# Patient Record
Sex: Male | Born: 1960 | Race: White | Hispanic: No | Marital: Married | Smoking: Current every day smoker
Health system: Southern US, Community
[De-identification: ages and names within clinical notes are randomized; demographics above are authoritative.]

## PROBLEM LIST (undated history)

## (undated) DIAGNOSIS — F419 Anxiety disorder, unspecified: Secondary | ICD-10-CM

## (undated) DIAGNOSIS — I10 Essential (primary) hypertension: Secondary | ICD-10-CM

## (undated) DIAGNOSIS — Z8739 Personal history of other diseases of the musculoskeletal system and connective tissue: Secondary | ICD-10-CM

## (undated) DIAGNOSIS — H903 Sensorineural hearing loss, bilateral: Secondary | ICD-10-CM

## (undated) DIAGNOSIS — N189 Chronic kidney disease, unspecified: Secondary | ICD-10-CM

## (undated) DIAGNOSIS — E119 Type 2 diabetes mellitus without complications: Secondary | ICD-10-CM

## (undated) DIAGNOSIS — E785 Hyperlipidemia, unspecified: Secondary | ICD-10-CM

## (undated) DIAGNOSIS — K219 Gastro-esophageal reflux disease without esophagitis: Secondary | ICD-10-CM

## (undated) DIAGNOSIS — IMO0002 Reserved for concepts with insufficient information to code with codable children: Secondary | ICD-10-CM

## (undated) DIAGNOSIS — F1910 Other psychoactive substance abuse, uncomplicated: Secondary | ICD-10-CM

## (undated) DIAGNOSIS — I8290 Acute embolism and thrombosis of unspecified vein: Secondary | ICD-10-CM

## (undated) DIAGNOSIS — M940 Chondrocostal junction syndrome [Tietze]: Secondary | ICD-10-CM

## (undated) DIAGNOSIS — I639 Cerebral infarction, unspecified: Secondary | ICD-10-CM

## (undated) DIAGNOSIS — T7840XA Allergy, unspecified, initial encounter: Secondary | ICD-10-CM

## (undated) DIAGNOSIS — Z8619 Personal history of other infectious and parasitic diseases: Secondary | ICD-10-CM

## (undated) DIAGNOSIS — K5792 Diverticulitis of intestine, part unspecified, without perforation or abscess without bleeding: Secondary | ICD-10-CM

## (undated) DIAGNOSIS — G250 Essential tremor: Secondary | ICD-10-CM

## (undated) HISTORY — PX: INGUINAL HERNIA REPAIR: SHX194

## (undated) HISTORY — DX: Type 2 diabetes mellitus without complications: E11.9

## (undated) HISTORY — DX: Cerebral infarction, unspecified: I63.9

## (undated) HISTORY — PX: POLYPECTOMY: SHX149

## (undated) HISTORY — DX: Anxiety disorder, unspecified: F41.9

## (undated) HISTORY — DX: Reserved for concepts with insufficient information to code with codable children: IMO0002

## (undated) HISTORY — DX: Acute embolism and thrombosis of unspecified vein: I82.90

## (undated) HISTORY — DX: Personal history of other diseases of the musculoskeletal system and connective tissue: Z87.39

## (undated) HISTORY — DX: Essential (primary) hypertension: I10

## (undated) HISTORY — PX: COLONOSCOPY: SHX174

## (undated) HISTORY — DX: Sensorineural hearing loss, bilateral: H90.3

## (undated) HISTORY — DX: Essential tremor: G25.0

## (undated) HISTORY — DX: Chronic kidney disease, unspecified: N18.9

## (undated) HISTORY — DX: Chondrocostal junction syndrome (tietze): M94.0

## (undated) HISTORY — DX: Gastro-esophageal reflux disease without esophagitis: K21.9

## (undated) HISTORY — DX: Personal history of other infectious and parasitic diseases: Z86.19

## (undated) HISTORY — DX: Other psychoactive substance abuse, uncomplicated: F19.10

## (undated) HISTORY — PX: OTHER SURGICAL HISTORY: SHX169

## (undated) HISTORY — DX: Hyperlipidemia, unspecified: E78.5

## (undated) HISTORY — DX: Diverticulitis of intestine, part unspecified, without perforation or abscess without bleeding: K57.92

## (undated) HISTORY — DX: Allergy, unspecified, initial encounter: T78.40XA

---

## 1998-07-02 ENCOUNTER — Emergency Department (HOSPITAL_COMMUNITY): Admission: EM | Admit: 1998-07-02 | Discharge: 1998-07-02 | Payer: Self-pay | Admitting: Emergency Medicine

## 1998-07-03 ENCOUNTER — Encounter: Admission: RE | Admit: 1998-07-03 | Discharge: 1998-10-01 | Payer: Self-pay | Admitting: Internal Medicine

## 1999-12-23 ENCOUNTER — Inpatient Hospital Stay (HOSPITAL_COMMUNITY): Admission: EM | Admit: 1999-12-23 | Discharge: 1999-12-23 | Payer: Self-pay | Admitting: Internal Medicine

## 1999-12-23 ENCOUNTER — Encounter: Payer: Self-pay | Admitting: Internal Medicine

## 2005-10-07 ENCOUNTER — Ambulatory Visit: Payer: Self-pay | Admitting: Internal Medicine

## 2007-12-21 ENCOUNTER — Inpatient Hospital Stay (HOSPITAL_COMMUNITY): Admission: EM | Admit: 2007-12-21 | Discharge: 2007-12-24 | Payer: Self-pay | Admitting: Emergency Medicine

## 2007-12-21 ENCOUNTER — Ambulatory Visit: Payer: Self-pay | Admitting: Cardiology

## 2007-12-21 ENCOUNTER — Ambulatory Visit: Payer: Self-pay | Admitting: Pulmonary Disease

## 2007-12-21 ENCOUNTER — Encounter (INDEPENDENT_AMBULATORY_CARE_PROVIDER_SITE_OTHER): Payer: Self-pay | Admitting: Neurology

## 2007-12-22 ENCOUNTER — Encounter (INDEPENDENT_AMBULATORY_CARE_PROVIDER_SITE_OTHER): Payer: Self-pay | Admitting: Neurology

## 2007-12-23 ENCOUNTER — Encounter (INDEPENDENT_AMBULATORY_CARE_PROVIDER_SITE_OTHER): Payer: Self-pay | Admitting: Neurology

## 2007-12-28 ENCOUNTER — Encounter: Payer: Self-pay | Admitting: *Deleted

## 2007-12-28 DIAGNOSIS — Z8619 Personal history of other infectious and parasitic diseases: Secondary | ICD-10-CM | POA: Insufficient documentation

## 2007-12-28 DIAGNOSIS — Z8719 Personal history of other diseases of the digestive system: Secondary | ICD-10-CM | POA: Insufficient documentation

## 2007-12-28 DIAGNOSIS — M545 Low back pain, unspecified: Secondary | ICD-10-CM | POA: Insufficient documentation

## 2007-12-28 DIAGNOSIS — H903 Sensorineural hearing loss, bilateral: Secondary | ICD-10-CM

## 2007-12-28 DIAGNOSIS — Z9189 Other specified personal risk factors, not elsewhere classified: Secondary | ICD-10-CM | POA: Insufficient documentation

## 2007-12-28 DIAGNOSIS — Z9889 Other specified postprocedural states: Secondary | ICD-10-CM | POA: Insufficient documentation

## 2007-12-28 DIAGNOSIS — IMO0002 Reserved for concepts with insufficient information to code with codable children: Secondary | ICD-10-CM | POA: Insufficient documentation

## 2007-12-29 ENCOUNTER — Ambulatory Visit: Payer: Self-pay | Admitting: Internal Medicine

## 2007-12-29 DIAGNOSIS — I6340 Cerebral infarction due to embolism of unspecified cerebral artery: Secondary | ICD-10-CM

## 2008-01-05 ENCOUNTER — Telehealth: Payer: Self-pay | Admitting: Internal Medicine

## 2008-01-31 ENCOUNTER — Encounter: Payer: Self-pay | Admitting: Internal Medicine

## 2008-04-03 ENCOUNTER — Encounter: Payer: Self-pay | Admitting: Interventional Radiology

## 2008-04-06 ENCOUNTER — Ambulatory Visit: Payer: Self-pay | Admitting: Internal Medicine

## 2008-04-21 ENCOUNTER — Encounter: Payer: Self-pay | Admitting: Internal Medicine

## 2008-05-10 ENCOUNTER — Encounter: Payer: Self-pay | Admitting: Internal Medicine

## 2008-05-23 ENCOUNTER — Encounter: Payer: Self-pay | Admitting: Internal Medicine

## 2008-06-16 ENCOUNTER — Encounter: Payer: Self-pay | Admitting: Internal Medicine

## 2008-10-13 ENCOUNTER — Encounter: Payer: Self-pay | Admitting: Internal Medicine

## 2008-12-28 ENCOUNTER — Encounter: Payer: Self-pay | Admitting: Internal Medicine

## 2009-06-14 ENCOUNTER — Encounter: Payer: Self-pay | Admitting: Internal Medicine

## 2009-07-13 ENCOUNTER — Encounter: Payer: Self-pay | Admitting: Internal Medicine

## 2009-08-07 ENCOUNTER — Ambulatory Visit: Payer: Self-pay | Admitting: Internal Medicine

## 2009-08-07 DIAGNOSIS — M25569 Pain in unspecified knee: Secondary | ICD-10-CM

## 2009-10-29 ENCOUNTER — Ambulatory Visit: Payer: Self-pay | Admitting: Internal Medicine

## 2009-10-29 DIAGNOSIS — E785 Hyperlipidemia, unspecified: Secondary | ICD-10-CM

## 2009-10-29 DIAGNOSIS — E1169 Type 2 diabetes mellitus with other specified complication: Secondary | ICD-10-CM

## 2009-10-29 DIAGNOSIS — I10 Essential (primary) hypertension: Secondary | ICD-10-CM | POA: Insufficient documentation

## 2009-10-29 LAB — CONVERTED CEMR LAB
ALT: 37 units/L (ref 0–53)
Alkaline Phosphatase: 49 units/L (ref 39–117)
BUN: 9 mg/dL (ref 6–23)
Bilirubin, Direct: 0.1 mg/dL (ref 0.0–0.3)
Cholesterol: 150 mg/dL (ref 0–200)
Creatinine, Ser: 1.1 mg/dL (ref 0.4–1.5)
GFR calc non Af Amer: 75.57 mL/min (ref >60)
Glucose, Bld: 105 mg/dL — ABNORMAL HIGH (ref 70–99)
Total Bilirubin: 0.8 mg/dL (ref 0.3–1.2)
Total Protein: 6.9 g/dL (ref 6.0–8.3)

## 2009-11-04 ENCOUNTER — Encounter: Payer: Self-pay | Admitting: Internal Medicine

## 2009-12-07 ENCOUNTER — Ambulatory Visit: Payer: Self-pay | Admitting: Internal Medicine

## 2010-01-15 ENCOUNTER — Telehealth: Payer: Self-pay | Admitting: Internal Medicine

## 2010-12-31 NOTE — Progress Notes (Signed)
Summary: refill  Phone Note Refill Request Message from:  Fax from Pharmacy on January 15, 2010 2:10 PM  Refills Requested: Medication #1:  LISINOPRIL 10 MG TABS 1 once daily Initial call taken by: Rock Nephew CMA,  January 15, 2010 2:10 PM    Prescriptions: LISINOPRIL 10 MG TABS (LISINOPRIL) 1 once daily  #90 x 3   Entered by:   Rock Nephew CMA   Authorized by:   Jacques Navy MD   Signed by:   Rock Nephew CMA on 01/15/2010   Method used:   Electronically to        [id number] (ret[facility]       [address]       [place], [place]  [address]       Ph: [phone] or [phone]       Fax: [phone]   RxID:   [id number]

## 2010-12-31 NOTE — Assessment & Plan Note (Signed)
Summary: insect bite? seen by UC / SD   Vital Signs:  Patient profile:   50 year old male Height:      71 inches Weight:      226 pounds BMI:     31.63 O2 Sat:      95 % on Room air Temp:     97.0 degrees F oral Pulse rate:   94 / minute BP sitting:   136 / 80  (left arm) Cuff size:   regular  Vitals Entered By: Bill Salinas CMA (December 07, 2009 9:59 AM)  O2 Flow:  Room air CC: Pt here for evaluation of rash on his right lower leg. Pt states rash started about 1 month ago and became discolored (black) pt was seen at urgent care where he was prescribed Methlprednisone 4 mg dose pak and sulfameth/tmp 1 tab bid. pt state medication did help but he is still expirancing the itching /ab   Primary Care Provider:  Norins  CC:  Pt here for evaluation of rash on his right lower leg. Pt states rash started about 1 month ago and became discolored (black) pt was seen at urgent care where he was prescribed Methlprednisone 4 mg dose pak and sulfameth/tmp 1 tab bid. pt state medication did help but he is still expirancing the itching /ab.  History of Present Illness: Patient presents for follow-up of a lesion on the distal LE. He noticed a small, 2mm lesion and then developed a surrounding area of erythema and bluis discoloration. He has been using neosporin on this. The erythema got better as did the ecchymosis but the small lesion persists.   Current Medications (verified): 1)  Hydrocodone-Acetaminophen 7.5-750 Mg  Tabs (Hydrocodone-Acetaminophen) .... Take As Needed and As Directed. 2)  Plavix 75 Mg Tabs (Clopidogrel Bisulfate) .Marland Kitchen.. 1 By Mouth Once Daily 3)  Tylenol Extra Strength 500 Mg  Tabs (Acetaminophen) .... As Needed 4)  Lisinopril 10 Mg Tabs (Lisinopril) .Marland Kitchen.. 1 Once Daily 5)  Pravastatin Sodium 40 Mg Tabs (Pravastatin Sodium) .Marland Kitchen.. 1 Tab Daily 6)  Mupirocin 2 % Oint (Mupirocin) .... Apply To Rash Two Times A Day  Allergies (verified): No Known Drug Allergies PMH-FH-SH reviewed-no  changes except otherwise noted  Review of Systems       The patient complains of suspicious skin lesions.  The patient denies anorexia, fever, weight loss, weight gain, chest pain, syncope, dyspnea on exertion, abdominal pain, muscle weakness, and unusual weight change.    Physical Exam  General:  Well-developed,well-nourished,in no acute distress; alert,appropriate and cooperative throughout examination Skin:  distal right LE with a 2mm lesion that looks like an insect bite with minimal erythema around the edge. No heat or tenderness. He was treated at an urgent care with septra which would have provided good coverage for MRSA.   Impression & Recommendations:  Problem # 1:  SKIN LESION, BENIGN (ICD-709.9) benign appearing skin lesion with no evidence of active infection. NO further testing or treatment needed at this time. He is to call back if the area develops erythema, heat or pain.  Complete Medication List: 1)  Hydrocodone-acetaminophen 7.5-750 Mg Tabs (Hydrocodone-acetaminophen) .... Take as needed and as directed. 2)  Plavix 75 Mg Tabs (Clopidogrel bisulfate) .Marland Kitchen.. 1 by mouth once daily 3)  Tylenol Extra Strength 500 Mg Tabs (Acetaminophen) .... As needed 4)  Lisinopril 10 Mg Tabs (Lisinopril) .Marland Kitchen.. 1 once daily 5)  Pravastatin Sodium 40 Mg Tabs (Pravastatin sodium) .Marland Kitchen.. 1 tab daily 6)  Mupirocin 2 %  Oint (Mupirocin) .... Apply to rash two times a day

## 2011-03-19 ENCOUNTER — Other Ambulatory Visit (INDEPENDENT_AMBULATORY_CARE_PROVIDER_SITE_OTHER): Payer: BC Managed Care – PPO

## 2011-03-19 ENCOUNTER — Other Ambulatory Visit: Payer: Self-pay | Admitting: Internal Medicine

## 2011-03-19 ENCOUNTER — Other Ambulatory Visit (INDEPENDENT_AMBULATORY_CARE_PROVIDER_SITE_OTHER): Payer: BC Managed Care – PPO | Admitting: Internal Medicine

## 2011-03-19 DIAGNOSIS — Z0000 Encounter for general adult medical examination without abnormal findings: Secondary | ICD-10-CM

## 2011-03-19 DIAGNOSIS — Z1289 Encounter for screening for malignant neoplasm of other sites: Secondary | ICD-10-CM

## 2011-03-19 LAB — HEPATIC FUNCTION PANEL
ALT: 23 U/L (ref 0–53)
Bilirubin, Direct: 0.1 mg/dL (ref 0.0–0.3)
Total Bilirubin: 0.4 mg/dL (ref 0.3–1.2)

## 2011-03-19 LAB — BASIC METABOLIC PANEL
BUN: 12 mg/dL (ref 6–23)
Chloride: 105 mEq/L (ref 96–112)
Creatinine, Ser: 1 mg/dL (ref 0.4–1.5)
GFR: 81.99 mL/min (ref >60.00)
Glucose, Bld: 98 mg/dL (ref 70–99)

## 2011-03-19 LAB — LIPID PANEL
Cholesterol: 155 mg/dL (ref 0–200)
HDL: 33.7 mg/dL — ABNORMAL LOW (ref >39.00)
LDL Cholesterol: 100 mg/dL — ABNORMAL HIGH (ref 0–99)
Total CHOL/HDL Ratio: 5
Triglycerides: 107 mg/dL (ref 0.0–149.0)
VLDL: 21.4 mg/dL (ref 0.0–40.0)

## 2011-03-19 LAB — URINALYSIS, ROUTINE W REFLEX MICROSCOPIC
Bilirubin Urine: NEGATIVE
Hgb urine dipstick: NEGATIVE
Ketones, ur: NEGATIVE
Nitrite: NEGATIVE
Total Protein, Urine: NEGATIVE
Urine Glucose: NEGATIVE
pH: 6 (ref 5.0–8.0)

## 2011-03-19 LAB — CBC WITH DIFFERENTIAL/PLATELET
Basophils Absolute: 0.1 10*3/uL (ref 0.0–0.1)
Eosinophils Relative: 4.1 % (ref 0.0–5.0)
Lymphs Abs: 1.7 10*3/uL (ref 0.7–4.0)
MCV: 92.4 fl (ref 78.0–100.0)
Monocytes Absolute: 0.5 10*3/uL (ref 0.1–1.0)
Neutrophils Relative %: 59.5 % (ref 43.0–77.0)
Platelets: 291 10*3/uL (ref 150.0–400.0)
RDW: 14.2 % (ref 11.5–14.6)
WBC: 6.4 10*3/uL (ref 4.5–10.5)

## 2011-04-01 ENCOUNTER — Encounter: Payer: Self-pay | Admitting: Internal Medicine

## 2011-04-15 NOTE — H&P (Signed)
NAME:  Darrell Brown, Darrell Brown NO.:  [id number]   MEDICAL RECORD NO.:  [id number]          PATIENT TYPE:  INP   LOCATION:  1828                         FACILITY:  [facility]   PHYSICIAN:  Gustavus Messing. Orlin Hilding, M.D.DATE OF BIRTH:  Mar 30, 1961   DATE OF ADMISSION:  12/21/2007  DATE OF DISCHARGE:                              HISTORY & PHYSICAL   CHIEF COMPLAINT:  Garbled speech and right hemiparesis.  This was a code  stroke called at 12:42 a.m., with symptom onset sometime after 10:00,  when he was last seen normal.   HISTORY OF PRESENT ILLNESS:  Darrell Brown is a 50 year old right-handed  white man who is generally healthy.  At any rate, he does not go the  doctor on a regular basis, so any insidious medical problems are  unknown.  He is employed as a Nutritional therapist.  He was last normal at 10:00 p.m.  when he spoke to his daughter on the telephone.  However, he did not  return home at 11:00, when he usually comes home.  His wife called  around 11:30.  He did not answer the phone.  She called a few minutes  later.  He answered the phone, but was not making any sense.  She went  over to his job and found him there alone with confused speech and  weakness on the right side.  However, it looked like he was preparing to  shut up the store.  She called EMS, and he was transported emergently as  a code stroke to [facility] ER.   REVIEW OF SYSTEMS:  Negative, according to the wife, for any complaints.  No shortness of breath, chest pain, GI problems, GU problems,  musculoskeletal problems, palpitations, or anything all.  No headaches  or neurologic complaints.   PAST MEDICAL HISTORY:  1. Remote hemorrhoid surgery.  2. 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  He      does read lips.  3. No known history of hypertension, hyperlipidemia, or diabetes, but      he does not get regular medical care.   MEDICATIONS:  He takes p.r.n. Tylenol and Advil.  No prescription   medicines.   ALLERGIES:  NO KNOWN DRUG ALLERGIES.   SOCIAL HISTORY:  He is a Nutritional therapist.  He is married, with children.   FAMILY HISTORY:  Negative for stroke.   OBJECTIVE:  VITAL SIGNS:  On exam, blood pressure is 131/84,  respirations 20.  HEAD:  Normocephalic, atraumatic.  NECK:  Supple without bruits.  LUNGS:  Clear to auscultation.  HEART:  Regular rate and rhythm.  ABDOMEN:  Nontender.  SKIN:  Dry mucosa moist.  NUTRITIONAL STATUS:  Adequate.  NEUROLOGIC:  He is awake.  He is not able to answer either question for  me.  He obeys one command correctly.  Gaze is normal.  He has a right  visual field cut.  He has a minor right facial palsy.  There is no drift  in the left upper extremity.  He has a level II drift in the right upper  extremity.  This is quite variable throughout the exam.  Sometimes it is  weaker than other.  It is definitely not normal, however.  There is no  drift in either lower extremity.  There is no limb ataxia.  He does have  decreased sensation on the right to pinprick.  He has a moderately  severe aphasia and a moderate dysarthria.  No neglect.  Score is 11 on  the NIH Scale.  He apparently was an 66 when he first came in.  Within  half an hour, he was an 82, but he has not improved further.  Modified  Rankin score would be a 0.   TEST RESULTS:  CBG is 140, remaining BMET is pending.  White blood cell  count is mildly elevated at 12.  Hemoglobin, hematocrit, and  platelets  are normal.  INR is 0.9.  CT shows a questionable dense left MCA sign.   ASSESSMENT:  A 50 year old with aphasia, right hemiparesis, NIH is 10,  without known significant medical history.  CT shows a questionable  dense left MCA sign.  He missed the TPA window for IV intervention.  We  will get an emergent angiogram.  If no clot, he may be a candidate for  SENTIS.  He will need a stroke workup with echocardiogram, carotid  Doppler, homocysteine levels, lipid levels, etc., and will  need an MRI  at some point.      Catherine A. Orlin Hilding, M.D.  Electronically Signed     CAW/MEDQ  D:  12/21/2007  T:  12/21/2007  Job:  [id number]

## 2011-04-15 NOTE — Discharge Summary (Signed)
NAME:  Darrell Brown, Darrell Brown NO.:  [id number]   MEDICAL RECORD NO.:  [id number]          PATIENT TYPE:  INP   LOCATION:  4713                         FACILITY:  [facility]   PHYSICIAN:  Annie Main, N.P.      DATE OF BIRTH:  01-05-1961   DATE OF ADMISSION:  12/21/2007  DATE OF DISCHARGE:  12/24/2007                               DISCHARGE SUMMARY   DIAGNOSIS AT TIME OF DISCHARGE:  1. 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  2. Hyperhomocysteinemia  3. History of hemorrhoid surgery.  4. Partial deafness since childhood, due to childhood illness.  He      does read lips.   MEDICINES AT TIME OF DISCHARGE:  1. Metanx 1 p.o. daily.  2. Aggrenox one p.o. nightly through January 06, 2008 then increase to      b.i.d.  3. Aspirin 81 mg q.a.m. through January 06, 2008 then discontinue.  4. Tylenol 2 tablets before Aggrenox dose x1 week only, then      discontinue.   STUDIES PERFORMED:  1. CT of the brain on admission shows early changes in left MCA with      left parietal sulci somewhat compressed compared to the right, and      there may be a dense left MCA sign.  2. CT of the brain after cerebral angiogram shows small focus of      pooling contrast in what is likely lacunar infarct in the left      lateral basal ganglia.  There is also attenuation along the insular      cortex which may be part of a cytotoxic edema.  No obvious      hemispheric infarct.  Persistent mild compression of the left      parietal sulci.  Major vascular structures appear normal.  3. MRI of the brain shows small focal area of restricted diffusion in      the left posterior __________  of the left posterior peri-insular      area, and also focal area in the left parietal cortical region most      consistent with acute ischemia.  No hemorrhagic conversion seen.      Moderate thickening  of mucosa in the ethmoids, the frontal sinuses,      sphenoid sinuses, and maxillary sinuses.  4. MRA of the head.  Shows no occlusion, stenosis, dissections, or      aneurysms.  5. MRA of the neck shows possible mild narrowing of distal right      vertebrobasilar junction, otherwise study within normal limits.  6. Cerebral angiogram shows a distal M1 left MCA occlusion of a 3.2 mm      vessel complete with cannulation was obtained using 15 mg      interarterial tPA and left MCA with a single [facility] path retrieving      a thick fibrous hemorrhagic clot 4 mg as intra-arterial tPA was  given after that into the left ACA due to a piece breaking off.  7. Transesophageal echocardiogram showed a few bubbles in the left      atrium probably a passage, cannot rule out minimal PFO.  8. EKG shows normal sinus rhythm.   LABORATORY STUDIES:  CBC with hemoglobin 12.8, hematocrit 38.5.  Cholesterol 131, triglycerides 126, HDL 25, LDL 81, calcium 8.2.  CH-50  pending.  Sed rate 8, C-3 107, C-4 15. Hypercoagulable panel with  antithrombin-III 105.  Protein C functional 200, protein S functional  105, __________  anticoagulant not detected.  Homocystine elevated 36.8.  ANA negative.  Hemoglobin A1c 6.4.  Homocystine 26.4.   HISTORY OF PRESENT ILLNESS:  Darrell Brown is a 50 year old right-handed  Caucasian male who is generally healthy.  At any rate, he does not go to  the doctor on a regular basis.  So, any insidious medical problems are  unknown.  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  He was last seen normal at 10 p.m. when he spoke to his  daughter on the telephone.  However, he did not return home at 11  o'clock when he usually comes home.  His wife called around 11:30 and he  did not answer the phone.  She called back a few minutes later, when he  answered the phone, but was not making any sense.  She went over to his  job and found him there, alone, with confused  speech and weakness on the  right side.   She called EMS and he was transported emergently as a Code Stroke to the  emergency room.  CT of the brain showed no acute abnormality, but had a  dense left MCA sign.  He was out of the 3-hour tPA window.  An emergent  angiogram was performed where a clot was seen in the MCA, and was  dissolved with interarterial tPA and retrieved with the [facility] retrieval  device with 1 pass.  He was admitted to the neuro ICU for further  evaluation.   HOSPITAL COURSE:  The patient remained intubated for the first 24 hours  and   ENDOSCOPIST:  was extubated without difficulty.  He had bilateral  sheaths in, that were removed without difficulty.  He was evaluated by  PT and OT and felt to have no physical therapy needs at time of  discharge.  A transesophageal echocardiogram was performed to rule out  embolic source.  He did have positive bubble study on TEE, though not a  clear patent foramen ovale.  We will follow up in the office for a TCD  bubble study and emboli monitoring, as well as for outpatient cardiac  monitoring through [place] for possible atrial fibrillation.  His only  other risk factor identified during the hospitalization was  hyperhomocysteinemia for which he was placed on Metanx.   CONDITION AT DISCHARGE:  The patient alert and oriented x3.  No aphasia.  He is mildly dysarthric which is his baseline.  He has no cranial nerve  deficits.  No focal deficits, and his gait is steady.   DISCHARGE/PLAN:  1. Discharge home with family.  2. Aggrenox for secondary stroke prevention.  3. Call Dr. Marlis Edelson office and arrange a TCD bubble study and emboli      monitoring within 1 month.  4. Outpatient cardiac monitoring through [facility] cardiology--they will      call him.  5. Stop smoking.  6. Consider IRS trial  with Dr. Pearlean Brownie at followup.  7. Follow up with primary care physician within 1 month, or get a      primary care physician if he does not  have one.      Annie Main, N.P.     SB/MEDQ  D:  12/24/2007  T:  12/24/2007  Job:  [id number]   cc:   Pramod P. Pearlean Brownie, MD  [facility] Cardiology

## 2011-04-18 NOTE — H&P (Signed)
[facility]  Patient:    Darrell Brown, Darrell Brown                          MRN: [id number] Adm. Date:  12/23/99 Attending:  Rosalyn Gess. Norins, M.D. [facility]                         History and Physical  DATE OF BIRTH:  26-Sep-1961  HISTORY OF PRESENT ILLNESS:  The patient is a 50 year old gentleman who presents to the office complaining of severe abdominal pains for several days in the left lower quadrant near his groin.  In the office the patient was noted to have stable vital signs but had exquisite and significant tenderness in his abdomen.  The patient is subsequently referred to the emergency department at [facility] for CT scan to rule out kidney stone or other acute sources of abdominal pain.  PAST MEDICAL HISTORY:  Surgical:  The patient had a left inguinal repair at age 50. Cyst removed from his left neck in 1999.  The patient had a chip fracture to his left ankle.  The patient had chicken pox and mumps as a child.  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  The patient has a history of degenerative disk disease.  The patient has benign familial tremor.  The patient has a history of urethritis.  HABITS:  Tobacco:  One pack-per-day.  The patient uses alcohol rarely.  MEDICATIONS ON ADMISSION:  No alleging drugs.  FAMILY HISTORY:  Positive for diabetes in his mother.  Heart disease in his maternal grandfather.  Parkinsons disease in his father.  History is negative for colon cancer or prostate cancer.  SOCIAL HISTORY:  The patient has been married 20 years.  He has a son 64, a daughter 51.  The patient is a Paediatric nurse working for [employer]  REVIEW OF SYSTEMS:   Negative for any constitutional, cardiovascular, respiratory, or GI problems.  PHYSICAL EXAMINATION:  VITAL SIGNS:  Examination performed in the office revealed the patient to have  temperature of 98.1, blood pressure 122/80, pulse 86,  weight 210.  GENERAL APPEARANCE:  This is a well-nourished gentleman in no acute distress.  HEENT:  Normocephalic, atraumatic and unremarkable.  NECK:  Supple without thyromegaly.  CHEST:  Clear to auscultation and percussion.  CARDIOVASCULAR:  Pulses 2+ with a regular rate and rhythm without murmurs, rubs or gallops.  ABDOMEN:  The patient had hypoactive bowel sounds.  He had exclusive tenderness in the left lower quadrant.  No bulging.  No masses were noted.  RECTAL:  Examination deferred.  EXTREMITIES:  Normal.  NEUROLOGICAL:  Examination was nonfocal.  ASSESSMENT AND PLAN:  Left lower quadrant abdominal pain.  The patient was sent to the emergency room for admission to the hospital.  Concern for diverticulitis versus possible abscess versus stone.  Plan:  Admission, Unasyn 3 g IV q.8h.  Lab to include CBC with differential and  CMET.  CT scan of the abdomen with contrast is ordered. DD:  03/27/00 TD:  03/28/00 Job: [id number]

## 2011-05-19 ENCOUNTER — Encounter: Payer: Self-pay | Admitting: Internal Medicine

## 2011-05-20 ENCOUNTER — Ambulatory Visit (INDEPENDENT_AMBULATORY_CARE_PROVIDER_SITE_OTHER): Payer: BC Managed Care – PPO | Admitting: Internal Medicine

## 2011-05-20 ENCOUNTER — Encounter: Payer: Self-pay | Admitting: Internal Medicine

## 2011-05-20 VITALS — BP 138/90 | HR 87 | Temp 98.0°F | Ht 70.0 in | Wt 212.0 lb

## 2011-05-20 DIAGNOSIS — E785 Hyperlipidemia, unspecified: Secondary | ICD-10-CM

## 2011-05-20 DIAGNOSIS — M25569 Pain in unspecified knee: Secondary | ICD-10-CM

## 2011-05-20 DIAGNOSIS — Z0000 Encounter for general adult medical examination without abnormal findings: Secondary | ICD-10-CM

## 2011-05-20 DIAGNOSIS — I10 Essential (primary) hypertension: Secondary | ICD-10-CM

## 2011-05-20 DIAGNOSIS — Z136 Encounter for screening for cardiovascular disorders: Secondary | ICD-10-CM

## 2011-05-20 NOTE — Progress Notes (Signed)
Subjective:    Patient ID: Darrell Brown, male    DOB: 13-Oct-1961, 50 y.o.   MRN: [id number]  HPI Darrell Brown presents for a rouitne medical exam. He is feeling good. His only complaint is that he will blow out his right knee from time to time. He also will have paresthesia of the plantar aspect of the right foot intermittently. He has h/o sciatica that is intermittent.   Past Medical History  Diagnosis Date  . Costochondritis   . Familial tremor   . Degenerative disc disease   . Neural hearing loss, bilateral   . History of mumps   . History of chicken pox   . History of low back pain   . Diverticulitis   . 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 Cyst removed from neck   . Inguinal hernia repair     Left    Family History  Problem Relation Age of Onset  . Diabetes Mother   . Coronary artery disease Mother   . Parkinsonism Father   . Coronary artery disease Maternal Grandmother   . Cancer Neg Hx     colon, prostate   History   Social History  . Marital Status: Married    Spouse Name: N/A    Number of Children: N/A  . Years of Education: N/A   Occupational History  . plumbing supervisor [employer]   Social History Main Topics  . Smoking status: Current Everyday Smoker    Types: Cigarettes  . Smokeless tobacco: Not on file   Comment: 10 cigarettes per day  . Alcohol Use: Not on file  . Drug Use: Not on file  . Sexually Active: Not on file   Other Topics Concern  . Not on file   Social History Narrative   Married '811 son '86, 1 daughter '88Work: Clinical cytogeneticist for [employer]     Review of Systems Review of Systems  Constitutional:  Negative for fever, chills, activity change and unexpected weight change.  HEENT:  Negative for hearing loss, ear pain, congestion, neck stiffness and postnasal drip. Negative for sore throat or swallowing problems. Negative for dental  complaints.   Eyes: Negative for vision loss or change in visual acuity.  Respiratory: Negative for chest tightness and wheezing.   Cardiovascular: Negative for chest pain and palpitationNo decreased exercise tolerance Gastrointestinal: No change in bowel habit. No bloating or gas. No reflux or indigestion Genitourinary: Negative for urgency, frequency, flank pain and difficulty urinating.  Musculoskeletal: Negative for myalgias, back pain, arthralgias and gait problem.  Neurological: Negative for dizziness, tremors, weakness and headaches.  Hematological: Negative for adenopathy.  Psychiatric/Behavioral: Negative for behavioral problems and dysphoric mood.       Objective:   Physical Exam Vital signs reviewed Gen'l: Well nourished well developed white male in no acute distress  HEENT:  Head: Normocephalic and atraumatic.  Right Ear: External ear normal. EAC/TM nl Left Ear: External ear normal.  EAC/TM nl Nose: Nose normal.  Mouth/Throat: Oropharynx is clear and moist. Dentition - native, in good repair. No buccal or palatal lesions. Posterior pharynx clear. Eyes: Conjunctivae and sclera clear. EOM intact. Pupils are equal, round, and reactive to light. Right eye exhibits no discharge. Left eye exhibits no discharge. Normal disk margins. No AV nicking. Neck: Normal range of motion. Neck supple. No JVD present. No tracheal deviation present. No thyromegaly present.  Cardiovascular:  Normal rate, regular rhythm, no gallop, no friction rub, no murmur heard.      Quiet precordium. 2+ radial and DP pulses . No carotid bruits Pulmonary/Chest: Effort normal. No respiratory distress or increased WOB, no wheezes, no rales. No chest wall deformity or CVAT. Abdominal: Soft. Bowel sounds are normal in all quadrants. He exhibits no distension, no tenderness, no rebound or guarding, No heptosplenomegaly  Genitourinary:  deferred to normal PSA Musculoskeletal: Normal range of motion. He exhibits no  edema and no tenderness.       Small and large joints without redness, synovial thickening or deformity. Full range of motion preserved about all small, median and large joints.  Lymphadenopathy:    He has no cervical or supraclavicular adenopathy.  Neurological: He is alert and oriented to person, place, and time. CN II-XII intact except for hearing loss with subsequent speech change. DTRs 2+ and symmetrical biceps, radial tendons and 1+ patellar tendons. Cerebellar function normal with no tremor, rigidity, normal gait and station.  Skin: Skin is warm and dry. No rash noted. No erythema.  Psychiatric: He has a normal mood and affect. His behavior is normal. Thought content normal.   Lab Results  Component Value Date   WBC 6.4 03/19/2011   HGB 15.9 03/19/2011   HCT 47.5 03/19/2011   PLT 291.0 03/19/2011   CHOL 155 03/19/2011   TRIG 107.0 03/19/2011   HDL 33.70* 03/19/2011   ALT 23 03/19/2011   AST 21 03/19/2011   NA 141 03/19/2011   K 5.3* 03/19/2011   CL 105 03/19/2011   CREATININE 1.0 03/19/2011   BUN 12 03/19/2011   CO2 29 03/19/2011   TSH 1.21 03/19/2011   PSA 0.32 03/19/2011          Assessment & Plan:

## 2011-05-21 DIAGNOSIS — Z0000 Encounter for general adult medical examination without abnormal findings: Secondary | ICD-10-CM | POA: Insufficient documentation

## 2011-05-21 NOTE — Assessment & Plan Note (Signed)
Interval history is unremarkable. PHyiscal exam is normal. Lab results are in normal limits. He is a candidate for colorectal cancer screening by colonoscopy at his convenience. Immunizations: current with tetanus booster.  In summary - a very nice man who appears to be very healthy. He will return for routine exam in 1 year.

## 2011-05-21 NOTE — Assessment & Plan Note (Signed)
Exam with no instability of the knee  Plan - ortho consult when the pain gets worse or he has increasing trouble with knee collapse.

## 2011-05-21 NOTE — Assessment & Plan Note (Signed)
BP Readings from Last 3 Encounters:  05/20/11 138/90  12/07/09 136/80  10/29/09 118/82   Reasonable control on present medications.  Plan - continue home monitoring: if SBP stays in high 130's and DBP at 90+ he will need adjustment in medication

## 2011-05-21 NOTE — Assessment & Plan Note (Signed)
Adequate control on low dose pravastatin - 20mg  3 times a week.  Plan - continue present regimen           Follow- up lipid panel in 1 year.

## 2011-08-22 LAB — DIFFERENTIAL
Basophils Absolute: 0.2 — ABNORMAL HIGH
Lymphocytes Relative: 19
Monocytes Absolute: 0.8
Neutro Abs: 8.6 — ABNORMAL HIGH

## 2011-08-22 LAB — COMPREHENSIVE METABOLIC PANEL
Albumin: 4.1
BUN: 9
Calcium: 9.2
Chloride: 105
Creatinine, Ser: 1.19
Total Bilirubin: 0.6
Total Protein: 6.9

## 2011-08-22 LAB — HEMOGLOBIN A1C
Hgb A1c MFr Bld: 6.4 — ABNORMAL HIGH
Mean Plasma Glucose: 151

## 2011-08-22 LAB — PROTEIN C ACTIVITY: Protein C Activity: 200 % — ABNORMAL HIGH (ref 75–133)

## 2011-08-22 LAB — LUPUS ANTICOAGULANT PANEL: Lupus Anticoagulant: NOT DETECTED

## 2011-08-22 LAB — CK TOTAL AND CKMB (NOT AT ARMC)
CK, MB: 2.8
Relative Index: 1.1
Total CK: 250 — ABNORMAL HIGH

## 2011-08-22 LAB — CBC
Hemoglobin: 12.8 — ABNORMAL LOW
Platelets: 302
RBC: 4.19 — ABNORMAL LOW
RDW: 13.7
WBC: 9.9

## 2011-08-22 LAB — CARDIOLIPIN ANTIBODIES, IGG, IGM, IGA
Anticardiolipin IgA: 70 (ref <13)
Anticardiolipin IgG: 11 — ABNORMAL LOW (ref <11)
Anticardiolipin IgM: <7 — ABNORMAL LOW (ref <10)

## 2011-08-22 LAB — APTT
aPTT: 26
aPTT: 32

## 2011-08-22 LAB — TROPONIN I: Troponin I: 0.01

## 2011-08-22 LAB — PROTIME-INR: INR: 0.9

## 2011-08-22 LAB — BASIC METABOLIC PANEL
CO2: 28
Calcium: 8.2 — ABNORMAL LOW
GFR calc Af Amer: >60
GFR calc non Af Amer: >60
Sodium: 141

## 2011-08-22 LAB — PROTEIN C, TOTAL: Protein C, Total: 124 % (ref 70–140)

## 2011-08-22 LAB — FACTOR 5 LEIDEN

## 2011-08-22 LAB — PROTEIN S ACTIVITY: Protein S Activity: 105 % (ref 69–129)

## 2011-08-22 LAB — HOMOCYSTEINE: Homocysteine: 36.8 — ABNORMAL HIGH

## 2011-08-22 LAB — LIPID PANEL: Triglycerides: 126

## 2011-08-22 LAB — C4 COMPLEMENT: Complement C4, Body Fluid: 15 — ABNORMAL LOW

## 2011-08-22 LAB — COMPLEMENT, TOTAL: Compl, Total (CH50): 64 U/mL (ref 31–66)

## 2011-10-08 ENCOUNTER — Other Ambulatory Visit: Payer: Self-pay | Admitting: Internal Medicine

## 2011-10-14 ENCOUNTER — Other Ambulatory Visit: Payer: Self-pay | Admitting: Internal Medicine

## 2011-11-14 ENCOUNTER — Other Ambulatory Visit: Payer: Self-pay | Admitting: Internal Medicine

## 2012-02-16 ENCOUNTER — Other Ambulatory Visit: Payer: Self-pay | Admitting: Internal Medicine

## 2012-05-16 ENCOUNTER — Other Ambulatory Visit: Payer: Self-pay | Admitting: Internal Medicine

## 2012-07-09 ENCOUNTER — Other Ambulatory Visit: Payer: Self-pay | Admitting: Internal Medicine

## 2012-08-15 ENCOUNTER — Other Ambulatory Visit: Payer: Self-pay | Admitting: Internal Medicine

## 2012-09-08 ENCOUNTER — Other Ambulatory Visit (INDEPENDENT_AMBULATORY_CARE_PROVIDER_SITE_OTHER): Payer: BC Managed Care – PPO

## 2012-09-08 ENCOUNTER — Ambulatory Visit (INDEPENDENT_AMBULATORY_CARE_PROVIDER_SITE_OTHER)
Admission: RE | Admit: 2012-09-08 | Discharge: 2012-09-08 | Disposition: A | Discharge status: Home / Self Care | Payer: BC Managed Care – PPO | Source: Ambulatory Visit | Attending: Internal Medicine | Admitting: Internal Medicine

## 2012-09-08 ENCOUNTER — Encounter: Payer: Self-pay | Admitting: Internal Medicine

## 2012-09-08 ENCOUNTER — Ambulatory Visit (INDEPENDENT_AMBULATORY_CARE_PROVIDER_SITE_OTHER): Payer: BC Managed Care – PPO | Admitting: Internal Medicine

## 2012-09-08 VITALS — BP 112/70 | HR 86 | Temp 98.0°F | Resp 16 | Wt 212.0 lb

## 2012-09-08 DIAGNOSIS — Z720 Tobacco use: Secondary | ICD-10-CM

## 2012-09-08 DIAGNOSIS — Z1211 Encounter for screening for malignant neoplasm of colon: Secondary | ICD-10-CM

## 2012-09-08 DIAGNOSIS — IMO0002 Reserved for concepts with insufficient information to code with codable children: Secondary | ICD-10-CM

## 2012-09-08 DIAGNOSIS — M545 Low back pain, unspecified: Secondary | ICD-10-CM

## 2012-09-08 DIAGNOSIS — I10 Essential (primary) hypertension: Secondary | ICD-10-CM

## 2012-09-08 DIAGNOSIS — F17200 Nicotine dependence, unspecified, uncomplicated: Secondary | ICD-10-CM

## 2012-09-08 DIAGNOSIS — Z0000 Encounter for general adult medical examination without abnormal findings: Secondary | ICD-10-CM

## 2012-09-08 DIAGNOSIS — M25561 Pain in right knee: Secondary | ICD-10-CM

## 2012-09-08 DIAGNOSIS — E785 Hyperlipidemia, unspecified: Secondary | ICD-10-CM

## 2012-09-08 DIAGNOSIS — M25569 Pain in unspecified knee: Secondary | ICD-10-CM

## 2012-09-08 LAB — HEPATIC FUNCTION PANEL
ALT: 21 U/L (ref 0–53)
AST: 21 U/L (ref 0–37)
Albumin: 4 g/dL (ref 3.5–5.2)
Alkaline Phosphatase: 46 U/L (ref 39–117)
Total Protein: 7.1 g/dL (ref 6.0–8.3)

## 2012-09-08 LAB — LIPID PANEL
HDL: 33.2 mg/dL — ABNORMAL LOW (ref >39.00)
LDL Cholesterol: 97 mg/dL (ref 0–99)
Total CHOL/HDL Ratio: 5

## 2012-09-08 LAB — COMPREHENSIVE METABOLIC PANEL
ALT: 21 U/L (ref 0–53)
AST: 21 U/L (ref 0–37)
CO2: 25 mEq/L (ref 19–32)
GFR: 88.48 mL/min (ref >60.00)
Sodium: 137 mEq/L (ref 135–145)
Total Bilirubin: 0.8 mg/dL (ref 0.3–1.2)
Total Protein: 7.1 g/dL (ref 6.0–8.3)

## 2012-09-08 NOTE — Assessment & Plan Note (Signed)
Chronic knee pain - getting worse.  Plan - refer to Dr. Charlann Boxer for evaluation and intervention if needed.

## 2012-09-08 NOTE — Assessment & Plan Note (Signed)
Interval hx notable for sciatica type back pain and progressive knee pain,right. Physical exam is essentially normal. He is referred for routine colorectal cancer screening. Immunizations are up to date.   In summary - a very nice man who is medically stable except for knee pain and sciatica.

## 2012-09-08 NOTE — Assessment & Plan Note (Addendum)
Patient is on low dose pravachol. Last LDL = 100. He is taking pravachol 20 mg M-W-F due to joint pain.  Plan -   Routine follow up lab with recommendations to follow.   Addendum - LDL 97 - excellent. Will continue present medication

## 2012-09-08 NOTE — Assessment & Plan Note (Signed)
BP Readings from Last 3 Encounters:  09/08/12 112/70  05/20/11 138/90  12/07/09 136/80   Good control.  Plan Continue present treatment  Routine f/u lab

## 2012-09-08 NOTE — Progress Notes (Signed)
Subjective:    Patient ID: Darrell Brown, male    DOB: [birth year], 51 y.o.   MRN: [id number]  HPI Darrell Brown presents for routine wellness exam.   CC: he has 4-6 week h/o pain that starts in the right buttock, radiates to the posterior thigh and to the lateral distal leg. He has not taken anything for the pain except ibuprofen which can help but not always. He has a long h/o back pain. No imaging studies.  Since the leg has been hurting the knee has been hurting more. He has had collapse of the knee along with click.   He has a small flat lesion on the right zygoma that has been changing.  Past Medical History  Diagnosis Date  . Costochondritis   . Familial tremor   . Degenerative disc disease   . Neural hearing loss, bilateral   . History of mumps   . History of chicken pox   . History of low back pain   . Diverticulitis   . 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 Cyst removed from neck   . Inguinal hernia repair     Left    Family History  Problem Relation Age of Onset  . Diabetes Mother   . Coronary artery disease Mother   . Arthritis Mother     has had TKR, THR  . Parkinsonism Father   . Mental illness Father   . Stroke Father   . Coronary artery disease Maternal Grandmother   . Cancer Neg Hx     colon, prostate  . Hypertension Sister    History   Social History  . Marital Status: Married    Spouse Name: N/A    Number of Children: 2  . Years of Education: 12   Occupational History  . plumbing supervisor [employer]   Social History Main Topics  . Smoking status: Current Every Day Smoker -- 0.5 packs/day for 30 years    Types: Cigarettes  . Smokeless tobacco: Not on file   Comment: 10 cigarettes per day  . Alcohol Use: 0.5 oz/week    1 drink(s) per week  . Drug Use: No  . Sexually Active: Yes -- Male partner(s)   Other Topics Concern  . Not on file   Social History Narrative   HSG. Married '81. Wife with h/o breast cancer.1 son '85, 1 daughter '88. Work: Clinical cytogeneticist for [employer]. Life is good    Current Outpatient Prescriptions on File Prior to Visit  Medication Sig Dispense Refill  . clopidogrel (PLAVIX) 75 MG tablet TAKE 1 TABLET BY MOUTH EVERY DAY  90 tablet  3  . lisinopril (PRINIVIL,ZESTRIL) 10 MG tablet TAKE 1 TABLET EVERY DAY  90 tablet  1  . mupirocin (BACTROBAN) 2 % ointment Apply topically 2 (two) times daily.        . pravastatin (PRAVACHOL) 40 MG tablet TAKE 1 TABLET BY MOUTH AT BEDTIME  90 tablet  2  . acetaminophen (TYLENOL) 500 MG tablet Take 500 mg by mouth every 6 (six) hours as needed.        Marland Kitchen HYDROcodone-acetaminophen (VICODIN ES) 7.5-750 MG per tablet Take 1 tablet by mouth every 6 (six) hours as needed.                  Review of Systems System review is negative for any constitutional, cardiac, pulmonary, GI  or neuro symptoms or complaints other than as described in the HPI.     Objective:   Physical Exam Filed Vitals:   09/08/12 0909  BP: 112/70  Pulse: 86  Temp: 98 F (36.7 C)  Resp: 16   Wt Readings from Last 3 Encounters:  09/08/12 212 lb (96.163 kg)  05/20/11 212 lb (96.163 kg)  12/07/09 226 lb (102.513 kg)   Gen'l: Well nourished well developed white male in no acute distress  HEENT: Head: Normocephalic and atraumatic. Right Ear: External ear normal. EAC/TM nl. Left Ear: External ear normal.  EAC/TM nl. Nose: Nose normal. Mouth/Throat: Oropharynx is clear and moist. Dentition - native, in good repair. No buccal or palatal lesions. Posterior pharynx clear. Eyes: Conjunctivae and sclera clear. EOM intact. Pupils are equal, round, and reactive to light. Right eye exhibits no discharge. Left eye exhibits no discharge. Neck: Normal range of motion. Neck supple. No JVD present. No tracheal deviation present. No thyromegaly present.  Cardiovascular: Normal rate, regular rhythm, no gallop, no friction  rub, no murmur heard.      Quiet precordium. 2+ radial and DP pulses . No carotid bruits Pulmonary/Chest: Effort normal. No respiratory distress or increased WOB, no wheezes, no rales. No chest wall deformity or CVAT. Abdomen: Soft. Bowel sounds are normal in all quadrants. He exhibits no distension, no tenderness, no rebound or guarding, No heptosplenomegaly  Genitourinary: deferred  Musculoskeletal: Normal range of motion. He exhibits no edema and no tenderness.       Small and large joints without redness, synovial thickening or deformity. Full range of motion preserved about all small, median and large joints.  Back exam: normal stand; normal flex to greater than 100 degrees; normal gait; normal toe/heel walk; normal step up to exam table; normal SLR sitting; normal DTRs at the patellar tendons; normal sensation to light touch, pin-prick and deep vibratory stimulus; no  CVA tenderness; able to move supine to sitting witout assistance. Right knee - normal range of motion. No click, no crepitus. Lymphadenopathy:    He has no cervical or supraclavicular adenopathy.  Neurological: He is alert and oriented to person, place, and time. CN II-XII intact. DTRs 2+ and symmetrical biceps, radial and patellar tendons. Cerebellar function normal with no tremor, rigidity, normal gait and station.  Skin: Skin is warm and dry. No rash noted. No erythema.  Psychiatric: He has a normal mood and affect. His behavior is normal. Thought content normal.   Lab Results  Component Value Date   WBC 6.4 03/19/2011   HGB 15.9 03/19/2011   HCT 47.5 03/19/2011   PLT 291.0 03/19/2011   GLUCOSE 94 09/08/2012   CHOL 158 09/08/2012   TRIG 140.0 09/08/2012   HDL 33.20* 09/08/2012   LDLCALC 97 09/08/2012   ALT 21 09/08/2012   ALT 21 09/08/2012   AST 21 09/08/2012   AST 21 09/08/2012   NA 137 09/08/2012   K 4.2 09/08/2012   CL 105 09/08/2012   CREATININE 1.0 09/08/2012   BUN 11 09/08/2012   CO2 25 09/08/2012   TSH 1.21 03/19/2011    PSA 0.32 03/19/2011   INR 0.9 12/22/2007   HGBA1C  6.4  12/21/2007          Assessment & Plan:

## 2012-09-08 NOTE — Assessment & Plan Note (Signed)
3/4 pk/day smoker. Strongly advised to quit smoking.

## 2012-09-08 NOTE — Assessment & Plan Note (Signed)
Patient with right leg pain c/w sciatica  Plan - L-S spine films with recommendations to follow  Referred to [email or web address] for exercises specifically for sciatica

## 2012-09-08 NOTE — Patient Instructions (Addendum)
Good exam today. Labs ordered for today as well as x-ray of the back. You can [place] "MyChar" or a letter will be sent.  For the knee - referral to Dr. Royetta Car, [facility] [facility]  Will schedule you for colorectal cancer screening - colonoscopy.  Thanks for coming to see me.

## 2012-09-12 NOTE — Assessment & Plan Note (Signed)
Sciatica type pain with a non-radicular exam.  Plan  L-S spine films  Stretch and flex to help relieve pain  May use OTC NSAIDs.  Addendum: L-S spine films IMPRESSION:  1. No acute radiographic abnormality of the lumbar spine to  account for the patient's symptoms.  2. Mild anterior compression of L1 with less than 10% loss of  anterior vertebral body height, favor to be related to an old  injury.  3. Very mild multilevel degenerative disc disease.

## 2012-09-16 ENCOUNTER — Encounter: Payer: Self-pay | Admitting: Internal Medicine

## 2012-09-23 ENCOUNTER — Ambulatory Visit (INDEPENDENT_AMBULATORY_CARE_PROVIDER_SITE_OTHER): Payer: BC Managed Care – PPO | Admitting: Internal Medicine

## 2012-09-23 ENCOUNTER — Encounter: Payer: Self-pay | Admitting: Internal Medicine

## 2012-09-23 VITALS — BP 102/70 | HR 95 | Temp 97.4°F | Resp 16 | Wt 215.0 lb

## 2012-09-23 DIAGNOSIS — D489 Neoplasm of uncertain behavior, unspecified: Secondary | ICD-10-CM

## 2012-09-23 NOTE — Progress Notes (Signed)
  Subjective:    Patient ID: Darrell Brown, male    DOB: 04-May-1961, 51 y.o.   MRN: [id number]  HPI Darrell Brown presents for punch biopsy of two suspicious lesion: right cheek at the zygoma and the left cheek at the zygoma. Both are dark and have been changing. He does get a lot of sun exposure.  PMH, FamHx and SocHx reviewed for any changes and relevance.  Medications - reviewed   Review of Systems System review is negative for any constitutional, cardiac, pulmonary, GI or neuro symptoms or complaints other than as described in the HPI.     Objective:   Physical Exam Filed Vitals:   09/23/12 0932  BP: 102/70  Pulse: 95  Temp: 97.4 F (36.3 C)  Resp: 16   Derm - right cheek at the zygoma a macular lesion 1.2 cm across with variegated color - dark at places  Left check at the zygoma a macular-papular black lesion slightly raised but with well circumscribed border.  Procedure: punch biopsy x 2 Indication: lesion with uncertain behavior, potential neoplasm Consent: after full explanation of risks of bleeding, infection and complications patient gives written/oral consent Location: Anesthesia: 2% xylocaine with epinephrine Prep: betadine followed by alcohol Using a 3 Mm punch the specimen was sampled from each site. Specimens sent to pathology Wound: closed wound with 4-o ethilon,  1 Interrupted sutures Patient tolerated procedure well. They are given routine wound precautions. They will return in 1 week for suture removal.         Assessment & Plan:  Neoplasms of uncertain behavior - punch bx performed - path reports pending.

## 2012-09-29 ENCOUNTER — Ambulatory Visit (INDEPENDENT_AMBULATORY_CARE_PROVIDER_SITE_OTHER): Payer: BC Managed Care – PPO | Admitting: Internal Medicine

## 2012-09-29 ENCOUNTER — Encounter: Payer: Self-pay | Admitting: Internal Medicine

## 2012-09-29 VITALS — BP 114/72 | HR 98 | Temp 98.5°F | Resp 16 | Wt 215.0 lb

## 2012-09-29 DIAGNOSIS — Z4802 Encounter for removal of sutures: Secondary | ICD-10-CM

## 2012-09-29 DIAGNOSIS — C44319 Basal cell carcinoma of skin of other parts of face: Secondary | ICD-10-CM

## 2012-09-29 DIAGNOSIS — C44310 Basal cell carcinoma of skin of unspecified parts of face: Secondary | ICD-10-CM

## 2012-09-30 DIAGNOSIS — C44310 Basal cell carcinoma of skin of unspecified parts of face: Secondary | ICD-10-CM | POA: Insufficient documentation

## 2012-09-30 NOTE — Progress Notes (Signed)
  Subjective:    Patient ID: Darrell Brown, male    DOB: Oct 03, 1961, 51 y.o.   MRN: [id number]  HPI Darrell Brown returns for suture removal and to review path report after two punch biopsies of facial lesions. He has had no problems with drainage, erythema, pain or fever.  PMH, FamHx and SocHx reviewed for any changes and relevance.  Medications - reivewed   Review of Systems System review is negative for any constitutional, cardiac, pulmonary, GI or neuro symptoms or complaints other than as described in the HPI.     Objective:   Physical Exam Filed Vitals:   09/29/12 1543  BP: 114/72  Pulse: 98  Temp: 98.5 F (36.9 C)  Resp: 16   Gen'l- WNWD white man Derm- right cheek punch site - well healed. Suture removed  Left check punch site - wound not completely healed but no signs of drainage or inflammation       Assessment & Plan:

## 2012-09-30 NOTE — Assessment & Plan Note (Signed)
Punch bx left cheek just below orbit - Oct '13 - basal cell carcinoma. Suture removed.  Plan - follow up of lesion - to watch for regrowth and to be inspected at next regular office visit.

## 2012-11-09 ENCOUNTER — Encounter: Payer: BC Managed Care – PPO | Admitting: Internal Medicine

## 2012-11-11 ENCOUNTER — Other Ambulatory Visit: Payer: Self-pay | Admitting: Internal Medicine

## 2012-11-17 ENCOUNTER — Encounter: Payer: Self-pay | Admitting: Internal Medicine

## 2012-11-18 ENCOUNTER — Telehealth: Payer: Self-pay | Admitting: Gastroenterology

## 2012-11-18 ENCOUNTER — Ambulatory Visit (INDEPENDENT_AMBULATORY_CARE_PROVIDER_SITE_OTHER): Payer: BC Managed Care – PPO | Admitting: Internal Medicine

## 2012-11-18 ENCOUNTER — Encounter: Payer: Self-pay | Admitting: Internal Medicine

## 2012-11-18 VITALS — BP 110/82 | HR 94 | Ht 70.0 in | Wt 216.0 lb

## 2012-11-18 DIAGNOSIS — Z5181 Encounter for therapeutic drug level monitoring: Secondary | ICD-10-CM

## 2012-11-18 DIAGNOSIS — Z1211 Encounter for screening for malignant neoplasm of colon: Secondary | ICD-10-CM

## 2012-11-18 DIAGNOSIS — Z7902 Long term (current) use of antithrombotics/antiplatelets: Secondary | ICD-10-CM

## 2012-11-18 MED ORDER — PEG-KCL-NACL-NASULF-NA ASC-C 100 G PO SOLR: 1.0000 | Freq: Once | ORAL | Status: DC

## 2012-11-18 NOTE — Progress Notes (Signed)
Patient ID: Darrell Brown, male   DOB: 1961/01/20, 51 y.o.   MRN: [id number]  SUBJECTIVE: HPI Mr. Darrell Brown is a 51 year old male with a past medical history of CVA secondary to cerebral embolism in 2009 on Plavix therapy, remote history of diverticulitis, degenerative disc disease who seen in consultation at the request of Dr. Roderic Ovens for evaluation a screening colonoscopy in the presence of chronic antiplatelet therapy.  He reports he is doing well today and is without specific complaint. He's never had a screening colonoscopy, and denies family history of colon cancer. He reports regular bowel habits without blood or melena. No abdominal pain. No diarrhea or constipation. No trouble with bad heartburn, dysphagia or odynophagia. Appetite is good and weight is stable.  Review of Systems  As per history of present illness, otherwise negative   Past Medical History  Diagnosis Date  . Costochondritis   . Familial tremor   . Degenerative disc disease   . Neural hearing loss, bilateral   . History of mumps   . History of chicken pox   . History of low back pain   . Diverticulitis   . 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 acetaminophen (TYLENOL) 500 MG tablet Take 500 mg by mouth every 6 (six) hours as needed.        . clopidogrel (PLAVIX) 75 MG tablet TAKE 1 TABLET BY MOUTH EVERY DAY  90 tablet  3  . lisinopril (PRINIVIL,ZESTRIL) 10 MG tablet TAKE 1 TABLET EVERY DAY  90 tablet  1  . mupirocin (BACTROBAN) 2 % ointment Apply topically 2 (two) times daily.        . pravastatin (PRAVACHOL) 40 MG tablet TAKE 1 TABLET BY MOUTH AT BEDTIME  90 tablet  0    No Known Allergies  Family History  Problem Relation Age of Onset  . Diabetes Mother   . Coronary artery disease Mother   . Arthritis Mother     has had TKR, THR  . Parkinsonism Father   . Mental illness Father   . Stroke Father   . Coronary artery disease  Maternal Grandmother   . Cancer Neg Hx     colon, prostate  . Hypertension Sister     History  Substance Use Topics  . Smoking status: Current Every Day Smoker -- 0.5 packs/day for 30 years    Types: Cigarettes  . Smokeless tobacco: Never Used     Comment: 10 cigarettes per day  . Alcohol Use: 0.5 oz/week    1 drink(s) per week    OBJECTIVE: BP 110/82  Pulse 94  Ht 5\' 10"  (1.778 m)  Wt 216 lb (97.977 kg)  BMI 30.99 kg/m2  SpO2 98% Constitutional: Well-developed and well-nourished. No distress. HEENT: Normocephalic and atraumatic. Oropharynx is clear and moist. No oropharyngeal exudate. Conjunctivae are normal. No scleral icterus. Cardiovascular: Normal rate, regular rhythm and intact distal pulses. No M/R/G Pulmonary/chest: Effort normal and breath sounds normal. No wheezing, rales or rhonchi. Abdominal: Soft, nontender, nondistended. Bowel sounds active throughout. There are no masses palpable. No hepatosplenomegaly. Extremities: no clubbing, cyanosis, or edema Neurological: Alert and oriented to person place and time. Skin: Skin is warm and dry. No rashes noted. Psychiatric: Normal mood and affect. Behavior is normal.  Labs and Imaging -- CBC    Component Value Date/Time   WBC 6.4 03/19/2011 0802   RBC 5.13 03/19/2011 0802  HGB 15.9 03/19/2011 0802   HCT 47.5 03/19/2011 0802   PLT 291.0 03/19/2011 0802   MCV 92.4 03/19/2011 0802   MCHC 33.6 03/19/2011 0802   RDW 14.2 03/19/2011 0802   LYMPHSABS 1.7 03/19/2011 0802   MONOABS 0.5 03/19/2011 0802   EOSABS 0.3 03/19/2011 0802   BASOSABS 0.1 03/19/2011 0802    CMP     Component Value Date/Time   NA 137 09/08/2012 1042   K 4.2 09/08/2012 1042   CL 105 09/08/2012 1042   CO2 25 09/08/2012 1042   GLUCOSE 94 09/08/2012 1042   BUN 11 09/08/2012 1042   CREATININE 1.0 09/08/2012 1042   CALCIUM 9.0 09/08/2012 1042   PROT 7.1 09/08/2012 1042   PROT 7.1 09/08/2012 1042   ALBUMIN 4.0 09/08/2012 1042   ALBUMIN 4.0 09/08/2012 1042   AST 21  09/08/2012 1042   AST 21 09/08/2012 1042   ALT 21 09/08/2012 1042   ALT 21 09/08/2012 1042   ALKPHOS 46 09/08/2012 1042   ALKPHOS 46 09/08/2012 1042   BILITOT 0.8 09/08/2012 1042   BILITOT 0.8 09/08/2012 1042   GFRNONAA 75.57 10/29/2009 1054   GFRAA  Value: >60        The eGFR has been calculated using the MDRD equation. This calculation has not been validated in all clinical 12/22/2007 0340      ASSESSMENT AND PLAN:  51 year old male with a past medical history of CVA secondary to cerebral embolism in 2009 on Plavix therapy, remote history of diverticulitis, degenerative disc disease who seen in consultation at the request of Dr. Roderic Ovens for evaluation a screening colonoscopy in the presence of chronic antiplatelet therapy.    1.  CRC screening/chronic antiplatelet therapy -- we've discussed screening colonoscopy, including risks and benefits, and I feel that this test is appropriate and he is agreeable to proceed.  We will need to discontinue his clopidogrel therapy 5 days prior to the procedure. We will contact Dr. Debby Bud for permission to old Plavix therapy 5 days prior to procedure, and possibly additional days if polypectomy has been performed.   He also has a history of diverticulitis, another reason to perform colonoscopy. No evidence of diverticulitis at present. Further recommendations after procedure.

## 2012-11-18 NOTE — Patient Instructions (Addendum)
You have been scheduled for a colonoscopy with propofol. Please follow written instructions given to you at your visit today.  Please pick up your prep kit at the pharmacy within the next 1-3 days. If you use inhalers (even only as needed) or a CPAP machine, please bring them with you on the day of your procedure.   We will contact you regarding you holding your plavix prior to your procedure.

## 2012-11-18 NOTE — Telephone Encounter (Signed)
4 days

## 2012-11-18 NOTE — Telephone Encounter (Signed)
  11/18/2012    RE: Darrell Brown DOB: [birth year] MRN: [id number]   Dear Dr. Debby Bud,    We have scheduled the above patient for an endoscopic procedure. Our records show that he is on anticoagulation therapy.   Please advise as to how long the patient may come off his therapy of Plavix prior to the procedure, which is scheduled for 01/06/2013.  Please fax back/ or route the completed form to [facility] at [phone].   Sincerely,  Dr. Carie Caddy. Pyrtle

## 2012-11-19 ENCOUNTER — Telehealth: Payer: Self-pay | Admitting: Gastroenterology

## 2012-11-19 NOTE — Telephone Encounter (Signed)
Spoke to pt. Regarding blood thinner, told him per Dr. Alvera Novel recommendations, to come off his blood thinner 4 days prior to his procedure. Pt stated understanding.

## 2012-12-13 ENCOUNTER — Encounter: Payer: BC Managed Care – PPO | Admitting: Internal Medicine

## 2013-01-05 ENCOUNTER — Other Ambulatory Visit: Payer: Self-pay | Admitting: Internal Medicine

## 2013-01-06 ENCOUNTER — Encounter: Payer: Self-pay | Admitting: Internal Medicine

## 2013-01-06 ENCOUNTER — Ambulatory Visit (AMBULATORY_SURGERY_CENTER): Payer: BC Managed Care – PPO | Admitting: Internal Medicine

## 2013-01-06 VITALS — BP 94/62 | HR 79 | Temp 97.6°F | Resp 20 | Ht 70.0 in | Wt 216.0 lb

## 2013-01-06 DIAGNOSIS — D126 Benign neoplasm of colon, unspecified: Secondary | ICD-10-CM

## 2013-01-06 DIAGNOSIS — Z1211 Encounter for screening for malignant neoplasm of colon: Secondary | ICD-10-CM

## 2013-01-06 MED ORDER — SODIUM CHLORIDE 0.9 % IV SOLN: 500.0000 mL | INTRAVENOUS | Status: DC

## 2013-01-06 NOTE — Progress Notes (Signed)
Patient did not experience any of the following events: a burn prior to discharge; a fall within the facility; wrong site/side/patient/procedure/implant event; or a hospital transfer or hospital admission upon discharge from the facility. (G8907) Patient did not have preoperative order for IV antibiotic SSI prophylaxis. (G8918)  

## 2013-01-06 NOTE — Patient Instructions (Addendum)

## 2013-01-06 NOTE — Progress Notes (Signed)
Called to room to assist during endoscopic procedure.  Patient ID and intended procedure confirmed with present staff. Received instructions for my participation in the procedure from the performing physician.  

## 2013-01-06 NOTE — Op Note (Signed)
 [facility] [address] [place], [address]   COLONOSCOPY PROCEDURE REPORT  PATIENT: [name], Darrell  MR#: [id number] BIRTHDATE: 04-14-1961 , 51  yrs. old GENDER: Male ENDOSCOPIST: Beverley Fiedler, MD REFERRED NW:GNFAOZ, Rosalyn Gess. PROCEDURE DATE:  01/06/2013 PROCEDURE:   Colonoscopy with snare polypectomy ASA CLASS:   Class II INDICATIONS:average risk screening and first colonoscopy. MEDICATIONS: MAC sedation, administered by CRNA and propofol (Diprivan) 300mg  IV  DESCRIPTION OF PROCEDURE:   After the risks benefits and alternatives of the procedure were thoroughly explained, informed consent was obtained.  A digital rectal exam revealed no rectal mass.   The LB [id number]  endoscope was introduced through the anus and advanced to the terminal ileum which was intubated for a short distance. No adverse events experienced.   The quality of the prep was good, using MoviPrep  The instrument was then slowly withdrawn as the colon was fully examined.   COLON FINDINGS: The mucosa appeared normal in the terminal ileum. Two sessile polyps measuring 5 mm in size were found in the transverse colon and descending colon.  Polypectomy was performed using cold snare.  All resections were complete and all polyp tissue was completely retrieved.   The colon mucosa was otherwise normal.  Retroflexed views revealed no abnormalities. The time to cecum=4 minutes 10 seconds.  Withdrawal time=13 minutes 07 seconds. The scope was withdrawn and the procedure completed. COMPLICATIONS: There were no complications.  ENDOSCOPIC IMPRESSION: 1.   Normal mucosa in the terminal ileum 2.   Two sessile polyps measuring 5 mm in size were found in the transverse colon and descending colon; Polypectomy was performed using cold snare 3.   The colon mucosa was otherwise normal  RECOMMENDATIONS: 1.  Await pathology results 2.  If the polyps removed today are proven to be  adenomatous (pre-cancerous) polyps, you will need a repeat colonoscopy in 5 years.  Otherwise you should continue to follow colorectal cancer screening guidelines for "routine risk" patients with colonoscopy in 10 years.  You will receive a letter within 1-2 weeks with the results of your biopsy as well as final recommendations.  Please call my office if you have not received a letter after 3 weeks. 3.  Can resume Plavix at regular dose tomorrow.   eSigned:  Beverley Fiedler, MD 01/06/2013 2:03 PM  cc: Jacques Navy, MD and The Patient

## 2013-01-07 ENCOUNTER — Telehealth: Payer: Self-pay | Admitting: *Deleted

## 2013-01-07 NOTE — Telephone Encounter (Signed)
No answer, message left for the patient. 

## 2013-01-11 ENCOUNTER — Encounter: Payer: Self-pay | Admitting: Internal Medicine

## 2013-04-12 ENCOUNTER — Telehealth: Payer: Self-pay

## 2013-04-12 NOTE — Telephone Encounter (Signed)
Pt called stating he stepped on a nail at work and his employer needed to know if his Tetanus was up to date. Pt advised immunization received in 2007. Pt also states that wound is small and has been cleaned and covered with a Band-Aid. Marland Kitchen

## 2013-05-26 ENCOUNTER — Other Ambulatory Visit: Payer: Self-pay | Admitting: Family Medicine

## 2013-05-26 ENCOUNTER — Ambulatory Visit
Admission: RE | Admit: 2013-05-26 | Discharge: 2013-05-26 | Disposition: A | Discharge status: Home / Self Care | Payer: Worker's Compensation | Source: Ambulatory Visit | Attending: Family Medicine | Admitting: Family Medicine

## 2013-05-26 DIAGNOSIS — T1490XA Injury, unspecified, initial encounter: Secondary | ICD-10-CM

## 2013-07-15 ENCOUNTER — Other Ambulatory Visit: Payer: Self-pay | Admitting: Internal Medicine

## 2013-08-16 ENCOUNTER — Other Ambulatory Visit: Payer: Self-pay | Admitting: Internal Medicine

## 2014-01-02 ENCOUNTER — Encounter: Payer: Self-pay | Admitting: Internal Medicine

## 2014-01-02 ENCOUNTER — Ambulatory Visit (INDEPENDENT_AMBULATORY_CARE_PROVIDER_SITE_OTHER): Payer: BC Managed Care – PPO | Admitting: Internal Medicine

## 2014-01-02 VITALS — BP 160/100 | HR 84 | Temp 98.0°F | Wt 208.0 lb

## 2014-01-02 DIAGNOSIS — C44310 Basal cell carcinoma of skin of unspecified parts of face: Secondary | ICD-10-CM

## 2014-01-02 DIAGNOSIS — C44319 Basal cell carcinoma of skin of other parts of face: Secondary | ICD-10-CM

## 2014-01-02 DIAGNOSIS — D485 Neoplasm of uncertain behavior of skin: Secondary | ICD-10-CM

## 2014-01-02 NOTE — Assessment & Plan Note (Signed)
Recurrent lesion right temple -suspicious and does not look like recurrent seborrheic keratosis.  Plan Referral to Dermatology - Dr. Allyson Sabal

## 2014-01-02 NOTE — Patient Instructions (Signed)
Thanks for coming in to see me.  Reviewed your chart - in 2013 the lesion removed from the right cheek was a benign seborrheic keratosis. The lesion on the left cheek was a basal cell carcinoma with clear margins.  Now, with a recurrent lesion on the right, will refer to Dr. Nena Polio, dermatologist. Dennis Bast will hear either from our office or Dr. Ledell Peoples office. We will be sure we have prior authorization if needed.

## 2014-01-02 NOTE — Progress Notes (Signed)
Pre visit review using our clinic review tool, if applicable. No additional management support is needed unless otherwise documented below in the visit note. 

## 2014-01-02 NOTE — Progress Notes (Signed)
Subjective:    Patient ID: Darrell Brown, male    DOB: 03-20-1961, 53 y.o.   MRN: [id number]  HPI Darrell Brown presents for evaluation of recurrent skin lesion right temple. Chart reviewed: shave biopsy 2013 reported as seborrheic keratosis. He did have a basal cell carcinoma left check. The lesion at the right temple is noticeably changing.  He is otherwise doing fine. Still with some knee pain - he had declined MRI recommended by Ortho and he has cut back on stressful activities, e.g. Emogene Morgan boarding or downhill skiing.  Past Medical History  Diagnosis Date  . Costochondritis   . Familial tremor   . Degenerative disc disease   . Neural hearing loss, bilateral   . History of mumps   . History of chicken pox   . History of low back pain   . Diverticulitis   . 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 Cyst removed from neck    . Inguinal hernia repair      Left    Family History  Problem Relation Age of Onset  . Diabetes Mother   . Coronary artery disease Mother   . Arthritis Mother     has had TKR, THR  . Parkinsonism Father   . Mental illness Father   . Stroke Father   . Coronary artery disease Maternal Grandmother   . Cancer Neg Hx     colon, prostate  . Hypertension Sister    History   Social History  . Marital Status: Married    Spouse Name: N/A    Number of Children: 2  . Years of Education: 12   Occupational History  . plumbing supervisor [place] History Main Topics  . Smoking status: Current Every Day Smoker -- 0.50 packs/day for 30 years    Types: Cigarettes  . Smokeless tobacco: Never Used     Comment: 10 cigarettes per day  . Alcohol Use: 0.5 oz/week    1 drink(s) per week  . Drug Use: No  . Sexual Activity: Yes    Partners: Female   Other Topics Concern  . Not on file   Social History Narrative   HSG. Married '81. Wife with h/o breast cancer.1 son '85, 1  daughter '88. 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 Life is good    Current Outpatient Prescriptions on File Prior to Visit  Medication Sig Dispense Refill  . clopidogrel (PLAVIX) 75 MG tablet TAKE 1 TABLET BY MOUTH EVERY DAY  90 tablet  3  . lisinopril (PRINIVIL,ZESTRIL) 10 MG tablet TAKE 1 TABLET EVERY DAY  90 tablet  1  . pravastatin (PRAVACHOL) 40 MG tablet TAKE 1 TABLET BY MOUTH AT BEDTIME  90 tablet  0  . acetaminophen (TYLENOL) 500 MG tablet Take 500 mg by mouth every 6 (six) hours as needed.         No current facility-administered medications on file prior to visit.     Review of Systems System review is negative for any constitutional, cardiac, pulmonary, GI or neuro symptoms or complaints other than as described in the HPI.     Objective:   Physical Exam Filed Vitals:   01/02/14 1553  BP: 160/100  Pulse: 84  Temp: 98 F (36.7 C)   Gen'l- WNWD man in no distress Cor- 2+ radial pulse Pulm - normal respirations Derm - dark macular lesion  at right temple.       Assessment & Plan:

## 2014-01-10 ENCOUNTER — Other Ambulatory Visit: Payer: Self-pay | Admitting: Internal Medicine

## 2014-04-12 ENCOUNTER — Ambulatory Visit: Payer: BC Managed Care – PPO | Admitting: Physician Assistant

## 2014-04-12 ENCOUNTER — Ambulatory Visit (INDEPENDENT_AMBULATORY_CARE_PROVIDER_SITE_OTHER): Payer: BC Managed Care – PPO | Admitting: Physician Assistant

## 2014-04-12 ENCOUNTER — Encounter: Payer: Self-pay | Admitting: Physician Assistant

## 2014-04-12 VITALS — BP 130/76 | HR 80 | Ht 70.0 in | Wt 198.4 lb

## 2014-04-12 DIAGNOSIS — K625 Hemorrhage of anus and rectum: Secondary | ICD-10-CM

## 2014-04-12 DIAGNOSIS — K55039 Acute (reversible) ischemia of large intestine, extent unspecified: Secondary | ICD-10-CM

## 2014-04-12 DIAGNOSIS — R1032 Left lower quadrant pain: Secondary | ICD-10-CM

## 2014-04-12 DIAGNOSIS — Z860100 Personal history of colon polyps, unspecified: Secondary | ICD-10-CM

## 2014-04-12 DIAGNOSIS — K55059 Acute (reversible) ischemia of intestine, part and extent unspecified: Secondary | ICD-10-CM

## 2014-04-12 NOTE — Progress Notes (Addendum)
Subjective:    Patient ID: MARQ REBELLO, male    DOB: [birth year], 53 y.o.   MRN: [id number]  HPI Josep is a pleasant 53 year old white male known to Dr. Billie Lade who had undergone colonoscopy in February 2014 for screening. He was found to have normal mucosa in the terminal ileum and 2 sessile polyps- transverse and descending colon which were removed. One was a tubular adenoma one hyperplastic. Patient has history of CVA in 2009 and has been maintained on Plavix, also with history of hypertension and degenerative disc disease.  Patient comes in today after an episode which occurred this past weekend. He says he felt fine on Saturday but had worked outside all day long in the heat and was very tired that evening. He says about 11:00 that evening after he had  gone to bed he developed acute sharp abdominal pain followed by an episode of diaphoresis and then one episode of diarrhea. He says 3 or 4 hours later he was awakened again with abdominal cramping and then passed another bowel movement which contained bright red blood. He had  a lot of rumbling in his abdomen no nausea or vomiting. Following day he had no significant abdominal cramping that was tired and had one other bowel movement with bright red blood. On Monday 5/11 he had a normal-appearing bowel movement with bright red blood. No bowel movement yesterday and then today he says at one bowel movement with no obvious blood in the water but he says that he looked at stool very carefully and there may have been a small amount of blood in it. Otherwise he is feeling better he's had no fever or chills his appetite has improved he has some mild residual abdominal discomfort He had been to see his new primary care physician yesterday Dr. Thea Silversmith and was taken off of his blood pressure medicine ecause his  BP  Has a been running on the low side. Patient also states that he has lost about 15 pounds in the past 3 months. He seems a little worried about  this but says that he has changed his diet and has been eating less. Patient had labs done through Dr. Collier Flowers office yesterday -0.3 hemoglobin 15 hematocrit of 47.3 UA with small amount of leukocyte esterase prothrombin time and INR within normal limits electrolytes within normal limits LFTs normal albumin 4.3    Review of Systems  Constitutional: Positive for unexpected weight change.  HENT: Negative.   Respiratory: Negative.   Cardiovascular: Negative.   Gastrointestinal: Positive for abdominal pain and blood in stool.  Endocrine: Negative.   Genitourinary: Negative.   Musculoskeletal: Negative.   Neurological: Negative.   Hematological: Negative.   Psychiatric/Behavioral: Negative.    Outpatient Prescriptions Prior to Visit  Medication Sig Dispense Refill  . clopidogrel (PLAVIX) 75 MG tablet TAKE 1 TABLET BY MOUTH EVERY DAY  90 tablet  3  . pravastatin (PRAVACHOL) 40 MG tablet TAKE 1 TABLET BY MOUTH AT BEDTIME  90 tablet  0  . acetaminophen (TYLENOL) 500 MG tablet Take 500 mg by mouth every 6 (six) hours as needed.        Marland Kitchen lisinopril (PRINIVIL,ZESTRIL) 10 MG tablet TAKE 1 TABLET EVERY DAY  90 tablet  1   No facility-administered medications prior to visit.     No Known Allergies Patient Active Problem List   Diagnosis Date Noted  . Personal history of colonic polyps 04/12/2014  . Basal cell carcinoma of face 09/30/2012  .  Tobacco abuse 09/08/2012  . Routine general medical examination at a health care facility 05/21/2011  . Other and unspecified hyperlipidemia 10/29/2009  . ESSENTIAL HYPERTENSION 10/29/2009  . KNEE PAIN 08/07/2009  . CEREBRAL EMBOLISM WITH CEREBRAL INFARCTION 12/29/2007  . NEURAL HEARING LOSS BILATERAL 12/28/2007  . DEGENERATIVE DISC DISEASE 12/28/2007  . BACK PAIN, LUMBAR 12/28/2007  . DIVERTICULITIS, HX OF 12/28/2007  . INGUINAL HERNIORRHAPHY, LEFT, HX OF 12/28/2007   History  Substance Use Topics  . Smoking status: Current Every Day Smoker --  0.50 packs/day for 30 years    Types: Cigarettes  . Smokeless tobacco: Never Used     Comment: 10 cigarettes per day  . Alcohol Use: 0.5 oz/week    1 drink(s) per week    family history includes Arthritis in his mother; Coronary artery disease in his maternal grandmother and mother; Diabetes in his mother; Hypertension in his sister; Mental illness in his father; Parkinsonism in his father; Stroke in his father. There is no history of Cancer.  Objective:   Physical Exam  well-developed white male in no acute distress, accompanied by his wife blood pressure 130/76 pulse 80 height 5 foot 10 weight 198. 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 His symptoms are resolving at this time and he has not seen any blood today. Episode may have been precipitated by hypotension and volume depletion-his blood pressure medication was stopped as of yesterday #2 history of adenomatous colon polyp last colonoscopy February 2014 #3 weight loss-likely multifactorial #5 history of CVA on Plavix #6 history of bilateral hearing loss  Plan: Discussion with patient and his wife regarding the natural history of segmental ischemic colitis. His symptoms are much improved and should continue to resolve over the next 4-5 days. I do not think he needs to have a  definitive diagnosis with CT of the abdomen and pelvis but have offered this. Patient would like to wait. He will call back next week if he is still having abdominal discomfort and/or at any point if he has another episode of rectal bleeding. I also advised him that he should he continue to have weight loss over the next one month to call back and we will do further imaging.  Addendum: Reviewed and agree with initial management. Jerene Bears, MD

## 2014-04-12 NOTE — Patient Instructions (Signed)
Stay of the blood pressure medication. Keep well hydrated. Call for any futher episodes of bleeding or if your pain presists.  Call for appointment with Dr. Hilarie Fredrickson if you need to be seen.

## 2014-04-26 ENCOUNTER — Ambulatory Visit: Payer: Self-pay | Admitting: Physician Assistant

## 2014-11-01 ENCOUNTER — Emergency Department (HOSPITAL_COMMUNITY): Payer: BC Managed Care – PPO

## 2014-11-01 ENCOUNTER — Emergency Department (HOSPITAL_COMMUNITY)
Admission: EM | Admit: 2014-11-01 | Discharge: 2014-11-02 | Disposition: A | Discharge status: Home / Self Care | Payer: BC Managed Care – PPO | Attending: Emergency Medicine | Admitting: Emergency Medicine

## 2014-11-01 ENCOUNTER — Encounter (HOSPITAL_COMMUNITY): Payer: Self-pay | Admitting: Emergency Medicine

## 2014-11-01 DIAGNOSIS — Z79899 Other long term (current) drug therapy: Secondary | ICD-10-CM | POA: Insufficient documentation

## 2014-11-01 DIAGNOSIS — Z7902 Long term (current) use of antithrombotics/antiplatelets: Secondary | ICD-10-CM | POA: Diagnosis not present

## 2014-11-01 DIAGNOSIS — Z8619 Personal history of other infectious and parasitic diseases: Secondary | ICD-10-CM | POA: Diagnosis not present

## 2014-11-01 DIAGNOSIS — Z720 Tobacco use: Secondary | ICD-10-CM | POA: Diagnosis not present

## 2014-11-01 DIAGNOSIS — Z8739 Personal history of other diseases of the musculoskeletal system and connective tissue: Secondary | ICD-10-CM | POA: Diagnosis not present

## 2014-11-01 DIAGNOSIS — R42 Dizziness and giddiness: Secondary | ICD-10-CM

## 2014-11-01 DIAGNOSIS — R61 Generalized hyperhidrosis: Secondary | ICD-10-CM | POA: Insufficient documentation

## 2014-11-01 DIAGNOSIS — Z791 Long term (current) use of non-steroidal anti-inflammatories (NSAID): Secondary | ICD-10-CM | POA: Insufficient documentation

## 2014-11-01 LAB — COMPREHENSIVE METABOLIC PANEL
ALT: 23 U/L (ref 0–53)
AST: 21 U/L (ref 0–37)
Albumin: 4.3 g/dL (ref 3.5–5.2)
Alkaline Phosphatase: 63 U/L (ref 39–117)
Anion gap: 14 (ref 5–15)
BUN: 10 mg/dL (ref 6–23)
CALCIUM: 9.8 mg/dL (ref 8.4–10.5)
CO2: 24 meq/L (ref 19–32)
CREATININE: 0.95 mg/dL (ref 0.50–1.35)
Chloride: 102 mEq/L (ref 96–112)
GFR calc Af Amer: >90 mL/min (ref >90)
Glucose, Bld: 104 mg/dL — ABNORMAL HIGH (ref 70–99)
Potassium: 4.2 mEq/L (ref 3.7–5.3)
SODIUM: 140 meq/L (ref 137–147)
TOTAL PROTEIN: 7.3 g/dL (ref 6.0–8.3)
Total Bilirubin: 0.4 mg/dL (ref 0.3–1.2)

## 2014-11-01 LAB — DIFFERENTIAL
BASOS ABS: 0 10*3/uL (ref 0.0–0.1)
BASOS PCT: 0 % (ref 0–1)
EOS ABS: 0.1 10*3/uL (ref 0.0–0.7)
EOS PCT: 1 % (ref 0–5)
Lymphocytes Relative: 22 % (ref 12–46)
Lymphs Abs: 1.9 10*3/uL (ref 0.7–4.0)
MONO ABS: 0.8 10*3/uL (ref 0.1–1.0)
Monocytes Relative: 9 % (ref 3–12)
Neutro Abs: 6.1 10*3/uL (ref 1.7–7.7)
Neutrophils Relative %: 68 % (ref 43–77)

## 2014-11-01 LAB — CBC
HCT: 44.9 % (ref 39.0–52.0)
Hemoglobin: 15.3 g/dL (ref 13.0–17.0)
MCH: 29.6 pg (ref 26.0–34.0)
MCHC: 34.1 g/dL (ref 30.0–36.0)
MCV: 86.8 fL (ref 78.0–100.0)
Platelets: 273 K/uL (ref 150–400)
RBC: 5.17 MIL/uL (ref 4.22–5.81)
RDW: 13.4 % (ref 11.5–15.5)
WBC: 8.9 K/uL (ref 4.0–10.5)

## 2014-11-01 LAB — APTT: aPTT: 30 s (ref 24–37)

## 2014-11-01 LAB — PROTIME-INR
INR: 0.96 (ref 0.00–1.49)
Prothrombin Time: 12.9 s (ref 11.6–15.2)

## 2014-11-01 LAB — I-STAT TROPONIN, ED: Troponin i, poc: 0.01 ng/mL (ref 0.00–0.08)

## 2014-11-01 LAB — CBG MONITORING, ED: Glucose-Capillary: 105 mg/dL — ABNORMAL HIGH (ref 70–99)

## 2014-11-01 MED ORDER — MECLIZINE HCL 25 MG PO TABS: 25.0000 mg | ORAL_TABLET | Freq: Three times a day (TID) | ORAL | Status: DC | PRN

## 2014-11-01 MED ORDER — MECLIZINE HCL 25 MG PO TABS
25.0000 mg | ORAL_TABLET | Freq: Once | ORAL | Status: AC
  Administered 2014-11-01: 25 mg via ORAL
  Filled 2014-11-01: qty 1

## 2014-11-01 MED ORDER — IOHEXOL 350 MG/ML SOLN
50.0000 mL | Freq: Once | INTRAVENOUS | Status: AC | PRN
  Administered 2014-11-01: 50 mL via INTRAVENOUS

## 2014-11-01 NOTE — Consult Note (Signed)
Referring Physician: Jeneen Rinks    Chief Complaint: Dizziness  HPI: Darrell Brown is an 53 y.o. male who reports that he awakened today at baseline.  About 2pm he had the acute onset of dizziness that he describes as lightheadedness.  He as able to continue ambulating and with his other activities of the day.  He did note that the dizziness was worse with bending over, etc.   As the day progressed he also became diaphoretic.  This was very reminiscent of his infarct that he had in 2009 and he presented for evaluation at that time.  Since his infarct in 2009 the patient has had intermittent episodes of dizziness.  They are usually short lived and associated with head colds.  Patient continues to be dizzy at this time.  NIHSS of 1 for dysarthria that is premorbid.    Date last known well: Date: 11/01/2014 Time last known well: Time: 14:00 tPA Given: No: Outside time window  Past Medical History  Diagnosis Date  . Costochondritis   . Familial tremor   . Degenerative disc disease   . Neural hearing loss, bilateral   . History of mumps   . History of chicken pox   . History of low back pain   . Diverticulitis   . Cerebral embolism     With CVA- left MCA Jan '09    Past Surgical History  Procedure Laterality Date  . Cyst removed from neck    . Inguinal hernia repair      Left     Family History  Problem Relation Age of Onset  . Diabetes Mother   . Coronary artery disease Mother   . Arthritis Mother     has had TKR, THR  . Parkinsonism Father   . Mental illness Father   . Stroke Father   . Coronary artery disease Maternal Grandmother   . Cancer Neg Hx     colon, prostate  . Hypertension Sister    Social History:  reports that he has been smoking Cigarettes.  He has a 15 pack-year smoking history. He has never used smokeless tobacco. He reports that he drinks about 0.5 oz of alcohol per week. He reports that he does not use illicit drugs.  Allergies: No Known  Allergies  Medications: I have reviewed the patient's current medications. Prior to Admission:  No current facility-administered medications for this encounter. Current outpatient prescriptions:  ALPRAZolam (XANAX) 0.5 MG tablet, Take 0.5 mg by mouth., Disp: , Rfl: ;   clopidogrel (PLAVIX) 75 MG tablet, TAKE 1 TABLET BY MOUTH EVERY DAY, Disp: 90 tablet, Rfl: 3;  ibuprofen (ADVIL,MOTRIN) 200 MG tablet, Take 200 mg by mouth., Disp: , Rfl: ;   pravastatin (PRAVACHOL) 40 MG tablet, TAKE 1 TABLET BY MOUTH AT BEDTIME, Disp: 90 tablet, Rfl: 0  ROS: History obtained from the patient  General ROS: negative for - chills, fatigue, fever, night sweats, weight gain or weight loss Psychological ROS: negative for - behavioral disorder, hallucinations, memory difficulties, mood swings or suicidal ideation Ophthalmic ROS: negative for - blurry vision, double vision, eye pain or loss of vision ENT ROS: HOH Allergy and Immunology ROS: negative for - hives or itchy/watery eyes Hematological and Lymphatic ROS: negative for - bleeding problems, bruising or swollen lymph nodes Endocrine ROS: negative for - galactorrhea, hair pattern changes, polydipsia/polyuria or temperature intolerance Respiratory ROS: negative for - cough, hemoptysis, shortness of breath or wheezing Cardiovascular ROS: negative for - chest pain, dyspnea on exertion, edema or  irregular heartbeat Gastrointestinal ROS: negative for - abdominal pain, diarrhea, hematemesis, nausea/vomiting or stool incontinence Genito-Urinary ROS: negative for - dysuria, hematuria, incontinence or urinary frequency/urgency Musculoskeletal ROS: negative for - joint swelling or muscular weakness Neurological ROS: as noted in HPI Dermatological ROS: negative for rash and skin lesion changes  Physical Examination: Blood pressure 142/92, pulse 88, temperature 98 F (36.7 C), resp. rate 14, SpO2 95 %.  HEENT-  Normocephalic, no lesions, without obvious abnormality.   Normal external eye and conjunctiva.  Normal TM's bilaterally.  Normal auditory canals and external ears. Normal external nose, mucus membranes and septum.  Normal pharynx. Cardiovascular- S1, S2 normal, pulses palpable throughout   Lungs- chest clear, no wheezing, rales, normal symmetric air entry Abdomen- soft, non-tender; bowel sounds normal; no masses,  no organomegaly Extremities- no edema Lymph-no adenopathy palpable Musculoskeletal-no joint tenderness, deformity or swelling Skin-warm and dry, no hyperpigmentation, vitiligo, or suspicious lesions   Neurologic Examination: Mental Status: Alert, oriented, thought content appropriate.  Speech fluent without evidence of aphasia.  Dysarthric.  Able to follow 3 step commands without difficulty. Cranial Nerves: II: Discs flat bilaterally; Visual fields grossly normal, pupils equal, round, reactive to light and accommodation III,IV, VI: ptosis not present, extra-ocular motions intact bilaterally with nystagmus on left lateral gaze V,VII: smile symmetric, facial light touch sensation normal bilaterally VIII: hearing normal bilaterally IX,X: gag reflex present XI: bilateral shoulder shrug XII: midline tongue extension Motor: Right : Upper extremity   5/5    Left:     Upper extremity   5/5  Lower extremity   5/5     Lower extremity   5/5 Tone and bulk:normal tone throughout; no atrophy noted Sensory: Pinprick and light touch intact throughout, bilaterally Deep Tendon Reflexes: 2+ in the upper extremities and absent in the lower extremities Plantars: Right: mute   Left: mute Cerebellar: normal finger-to-nose and normal heel-to-shin testing bilaterally   Laboratory Studies:  Basic Metabolic Panel:  Recent Labs Lab 11/01/14 2005  NA 140  K 4.2  CL 102  CO2 24  GLUCOSE 104*  BUN 10  CREATININE 0.95  CALCIUM 9.8    Liver Function Tests:  Recent Labs Lab 11/01/14 2005  AST 21  ALT 23  ALKPHOS 63  BILITOT 0.4  PROT 7.3   ALBUMIN 4.3   No results for input(s): LIPASE, AMYLASE in the last 168 hours. No results for input(s): AMMONIA in the last 168 hours.  CBC:  Recent Labs Lab 11/01/14 2005  WBC 8.9  NEUTROABS 6.1  HGB 15.3  HCT 44.9  MCV 86.8  PLT 273    Cardiac Enzymes: No results for input(s): CKTOTAL, CKMB, CKMBINDEX, TROPONINI in the last 168 hours.  BNP: Invalid input(s): POCBNP  CBG: No results for input(s): GLUCAP in the last 168 hours.  Microbiology: No results found for this or any previous visit.  Coagulation Studies:  Recent Labs  11/01/14 2005  LABPROT 12.9  INR 0.96    Urinalysis: No results for input(s): COLORURINE, LABSPEC, PHURINE, GLUCOSEU, HGBUR, BILIRUBINUR, KETONESUR, PROTEINUR, UROBILINOGEN, NITRITE, LEUKOCYTESUR in the last 168 hours.  Invalid input(s): APPERANCEUR  Lipid Panel:    Component Value Date/Time   CHOL 158 09/08/2012 1042   TRIG 140.0 09/08/2012 1042   HDL 33.20* 09/08/2012 1042   CHOLHDL 5 09/08/2012 1042   VLDL 28.0 09/08/2012 1042   LDLCALC 97 09/08/2012 1042    HgbA1C:  Lab Results  Component Value Date   HGBA1C * 12/21/2007    6.4 (NOTE)  The ADA recommends the following therapeutic goals for glycemic   control related to Hgb A1C measurement:   Goal of Therapy:   < 7.0% Hgb A1C   Action Suggested:  > 8.0% Hgb A1C   Ref:  Diabetes Care, 22, Suppl. 1, 1999    Urine Drug Screen:  No results found for: LABOPIA, COCAINSCRNUR, LABBENZ, AMPHETMU, THCU, LABBARB  Alcohol Level: No results for input(s): ETH in the last 168 hours.   Imaging: Ct Head (brain) Wo Contrast  11/01/2014   CLINICAL DATA:  Code stroke  EXAM: CT HEAD WITHOUT CONTRAST  TECHNIQUE: Contiguous axial images were obtained from the base of the skull through the vertex without intravenous contrast.  COMPARISON:  12/21/2007  FINDINGS: Lacunar infarct in the posterior left putamen. No mass effect, midline shift, or acute intracranial hemorrhage. Mastoid air cells are  clear. Visualized paranasal sinuses are clear.  IMPRESSION: Chronic ischemic changes in the left basal ganglia. No acute intracranial pathology.   Electronically Signed   By: Maryclare Bean M.D.   On: 11/01/2014 20:33    Assessment: 53 y.o. male presenting with complaints of dizziness and diaphoresis.  Symptoms similar but not as severe as those experienced with his stroke in 2009.  Neurological examination only significant for nystagmus.  NIHSS of 1 secondary to dysarthria which is premorbid.  Head CT reviewed personally and shows no acute changes.  Patient on Plavix at home and compliant.      Stroke Risk Factors - hyperlipidemia and smoking  Plan: 1. Continue Plavix 2. Smoking cessation discussed 3.  CTA of the head and neck.  If no significant abnormalities noted patient may be discharged to home with f/u with outpatient PCP  Case discussed with Dr. Jeneen Rinks.     Alexis Goodell, MD [facility] [phone] 11/01/2014, 9:48 PM  Addendum:  CTA of head and neck personally reviewed and show no evidence of acute large vessel occlusion.  Patient to continue on Plavix.  May have Meclizine for symptomatic improvement of dizziness.  To f/u with PCP as an outpatient.  No further neurological intervention recommended at this time.    Alexis Goodell, MD [facility] [phone]

## 2014-11-01 NOTE — ED Provider Notes (Signed)
CSN: [id number]     Arrival date & time 11/01/14  1924 History   First MD Initiated Contact with Patient 11/01/14 2012     Chief Complaint  Patient presents with  . Code Stroke  . Excessive Sweating  . Dizziness    @EDPCLEARED @  HPI  Patient seen by myself at triage upon request of nursing in a patient with vertigo symptoms are reminiscent of prior stroke symptoms. Onset of symptoms at approximately 2:00 this afternoon while at work.  Last time no normal 1400  Patient states that he had a stroke several years ago and had symptoms similar with dizziness and vomiting. 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 Has minimal speech difficulty has resolved residual or sequelae from that episode. He said intermittent episodes of vertigo since that time there were mild. However, today he leaned over to work and became acutely symptomatic. He states he thought things were spinning and he was sweating and nauseated. He has improved somewhat. He states he is asymptomatic of holding still.  Denies headache. He denies numbness or weakness or tingling of extremities.  Past Medical History  Diagnosis Date  . Costochondritis   . Familial tremor   . Degenerative disc disease   . Neural hearing loss, bilateral   . History of mumps   . History of chicken pox   . History of low back pain   . Diverticulitis   . 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 Cyst removed from neck    . Inguinal hernia repair      Left    Family History  Problem Relation Age of Onset  . Diabetes Mother   . Coronary artery disease Mother   . Arthritis Mother     has had TKR, THR  . Parkinsonism Father   . Mental illness Father   . Stroke Father   . Coronary artery disease Maternal Grandmother   . Cancer Neg Hx     colon, prostate  . Hypertension Sister    History  Substance Use  Topics  . Smoking status: Current Every Day Smoker -- 0.50 packs/day for 30 years    Types: Cigarettes  . Smokeless tobacco: Never Used     Comment: 10 cigarettes per day  . Alcohol Use: 0.5 oz/week    1 drink(s) per week    Review of Systems  Constitutional: Negative for fever, chills, diaphoresis, appetite change and fatigue.  HENT: Negative for mouth sores, sore throat and trouble swallowing.   Eyes: Negative for visual disturbance.  Respiratory: Negative for cough, chest tightness, shortness of breath and wheezing.   Cardiovascular: Negative for chest pain.  Gastrointestinal: Positive for nausea. Negative for vomiting, abdominal pain, diarrhea and abdominal distention.  Endocrine: Negative for polydipsia, polyphagia and polyuria.  Genitourinary: Negative for dysuria, frequency and hematuria.  Musculoskeletal: Negative for gait problem.  Skin: Negative for color change, pallor and rash.  Neurological: Positive for dizziness. Negative for syncope, weakness, light-headedness, numbness and headaches.  Hematological: Does not bruise/bleed easily.  Psychiatric/Behavioral: Negative for behavioral problems and confusion.      Allergies  Review of patient's allergies indicates no known allergies.  Home Medications   Prior to Admission medications   Medication Sig Start Date End Date Taking? Authorizing Provider  cholecalciferol (VITAMIN D) 1000 UNITS tablet Take 1,000 Units by mouth daily.  Yes Historical Provider, MD  clopidogrel (PLAVIX) 75 MG tablet TAKE 1 TABLET BY MOUTH EVERY DAY 08/16/13  Yes Neena Rhymes, MD  ibuprofen (ADVIL,MOTRIN) 200 MG tablet Take 200-800 mg by mouth every 6 (six) hours as needed for moderate pain.   Yes Historical Provider, MD  pravastatin (PRAVACHOL) 20 MG tablet Take 1 tablet by mouth daily. 08/08/14  Yes Historical Provider, MD  ALPRAZolam Duanne Moron) 0.5 MG tablet Take 0.5 mg by mouth. 04/11/14   Historical Provider, MD  ibuprofen (ADVIL,MOTRIN) 200 MG  tablet Take 200 mg by mouth.    Historical Provider, MD  meclizine (ANTIVERT) 25 MG tablet Take 1 tablet (25 mg total) by mouth 3 (three) times daily as needed. 11/01/14   Tanna Furry, MD   BP 128/82 mmHg  Pulse 79  Temp(Src) 98 F (36.7 C)  Resp 15  SpO2 93% Physical Exam  Constitutional: He is oriented to person, place, and time. He appears well-developed and well-nourished. No distress.  HENT:  Head: Normocephalic.  Eyes: Conjunctivae are normal. Pupils are equal, round, and reactive to light. No scleral icterus.  Horizontal nystagmus to lateral gaze. Otherwise normal extra ocular movements.  Neck: Normal range of motion. Neck supple. No thyromegaly present.  Cardiovascular: Normal rate and regular rhythm.  Exam reveals no gallop and no friction rub.   No murmur heard. Pulmonary/Chest: Effort normal and breath sounds normal. No respiratory distress. He has no wheezes. He has no rales.  Abdominal: Soft. Bowel sounds are normal. He exhibits no distension. There is no tenderness. There is no rebound.  Musculoskeletal: Normal range of motion.  Neurological: He is alert and oriented to person, place, and time.  Normal symmetric Strength to shoulder shrug, triceps, biceps, grip,wrist flex/extend,and intrinsics  Norma lsymmetric sensation above and below clavicles, and to all distributions to UEs. Norma symmetric strength to flex/.extend hip and knees, dorsi/plantar flex ankles. Normal symmetric sensation to all distributions to LEs Patellar and achilles reflexes 1-2+. Downgoing Babinski   Skin: Skin is warm and dry. No rash noted.  Psychiatric: He has a normal mood and affect. His behavior is normal.    ED Course  Procedures (including critical care time) Labs Review Labs Reviewed  COMPREHENSIVE METABOLIC PANEL - Abnormal; Notable for the following:    Glucose, Bld 104 (*)    All other components within normal limits  CBG MONITORING, ED - Abnormal; Notable for the following:     Glucose-Capillary 105 (*)    All other components within normal limits  PROTIME-INR  APTT  CBC  DIFFERENTIAL  I-STAT TROPOININ, ED    Imaging Review Ct Angio Head W/cm &/or Wo Cm  11/01/2014   CLINICAL DATA:  Code stroke earlier today, dizziness with similar symptoms to stroke in 2009. 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  EXAM: CT ANGIOGRAPHY HEAD AND NECK  TECHNIQUE: Multidetector CT imaging of the head and neck was performed using the standard protocol during bolus administration of intravenous contrast. Multiplanar CT image reconstructions and MIPs were obtained to evaluate the vascular anatomy. Carotid stenosis measurements (when applicable) are obtained utilizing NASCET criteria, using the distal internal carotid diameter as the denominator.  CONTRAST:  66mL OMNIPAQUE IOHEXOL 350 MG/ML SOLN  COMPARISON:  CT of the head November 01, 2014 at 2018 hr  FINDINGS: CTA HEAD FINDINGS  Anterior circulation: Normal appearance of the cervical internal carotid arteries, petrous, cavernous and supra clinoid internal carotid arteries. Widely patent anterior communicating artery. Normal appearance of the anterior  and middle cerebral arteries.  Posterior circulation: LEFT vertebral artery is dominant with normal appearance of the vertebral arteries, vertebrobasilar junction and basilar artery, as well as main branch vessels. Normal appearance of the posterior cerebral arteries.  No large vessel occlusion, hemodynamically significant stenosis, dissection, luminal irregularity, contrast extravasation or aneurysm within the anterior nor posterior circulation.  Postcontrast imaging of the head demonstrates no abnormal intraparenchymal or extra-axial enhancement. Remote LEFT posterior putaminal subcentimeter infarct.  Review of the MIP images confirms the above findings.  CTA NECK FINDINGS  Normal appearance of the thoracic arch, normal branch pattern. Two vessel aortic arch is a  normal variant with mild calcific atherosclerosis. The origins of the innominate, left Common carotid artery and subclavian artery are widely patent.  Bilateral Common carotid arteries are widely patent, coursing in a straight line fashion. Normal appearance of the carotid bifurcations without hemodynamically significant stenosis by NASCET criteria. 1-2 mm eccentric calcific atherosclerosis and intimal thickening of the carotid bulbs. Normal appearance of the included internal carotid arteries.  Left vertebral artery is dominant. Focal symmetric luminal irregularity of the vertebral arteries at the skull base likely secondary to streak artifact from dental amalgam most conspicuous on the axial images.  No hemodynamically significant stenosis by NASCET criteria. No dissection, no pseudoaneurysm. No abnormal luminal irregularity. No contrast extravasation.  Soft tissues are nonsuspicious ; small intra parotid lymph nodes may be reactive. No acute osseous process though bone windows have not been submitted.  Review of the MIP images confirms the above findings.  IMPRESSION: CTA HEAD: Normal CT angiogram of the head ; no acute vascular process or findings of thromboembolism on today's study.  CTA NECK: No acute vascular process; mild atherosclerosis of the carotid bulbs without hemodynamically significant stenosis.  Symmetric irregularity of the vertebral arteries at the skullbase can be attributed to streak artifact from dental amalgam   Electronically Signed   By: Elon Alas   On: 11/01/2014 23:21   Ct Head (brain) Wo Contrast  11/01/2014   CLINICAL DATA:  Code stroke  EXAM: CT HEAD WITHOUT CONTRAST  TECHNIQUE: Contiguous axial images were obtained from the base of the skull through the vertex without intravenous contrast.  COMPARISON:  12/21/2007  FINDINGS: Lacunar infarct in the posterior left putamen. No mass effect, midline shift, or acute intracranial hemorrhage. Mastoid air cells are clear. Visualized  paranasal sinuses are clear.  IMPRESSION: Chronic ischemic changes in the left basal ganglia. No acute intracranial pathology.   Electronically Signed   By: Maryclare Bean M.D.   On: 11/01/2014 20:33   Ct Angio Neck W/cm &/or Wo/cm  11/01/2014   CLINICAL DATA:  Code stroke earlier today, dizziness with similar symptoms to stroke in 2009. 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  EXAM: CT ANGIOGRAPHY HEAD AND NECK  TECHNIQUE: Multidetector CT imaging of the head and neck was performed using the standard protocol during bolus administration of intravenous contrast. Multiplanar CT image reconstructions and MIPs were obtained to evaluate the vascular anatomy. Carotid stenosis measurements (when applicable) are obtained utilizing NASCET criteria, using the distal internal carotid diameter as the denominator.  CONTRAST:  59mL OMNIPAQUE IOHEXOL 350 MG/ML SOLN  COMPARISON:  CT of the head November 01, 2014 at 2018 hr  FINDINGS: CTA HEAD FINDINGS  Anterior circulation: Normal appearance of the cervical internal carotid arteries, petrous, cavernous and supra clinoid internal carotid arteries. Widely patent anterior communicating artery. Normal appearance of the anterior and middle cerebral arteries.  Posterior circulation:  LEFT vertebral artery is dominant with normal appearance of the vertebral arteries, vertebrobasilar junction and basilar artery, as well as main branch vessels. Normal appearance of the posterior cerebral arteries.  No large vessel occlusion, hemodynamically significant stenosis, dissection, luminal irregularity, contrast extravasation or aneurysm within the anterior nor posterior circulation.  Postcontrast imaging of the head demonstrates no abnormal intraparenchymal or extra-axial enhancement. Remote LEFT posterior putaminal subcentimeter infarct.  Review of the MIP images confirms the above findings.  CTA NECK FINDINGS  Normal appearance of the thoracic arch, normal  branch pattern. Two vessel aortic arch is a normal variant with mild calcific atherosclerosis. The origins of the innominate, left Common carotid artery and subclavian artery are widely patent.  Bilateral Common carotid arteries are widely patent, coursing in a straight line fashion. Normal appearance of the carotid bifurcations without hemodynamically significant stenosis by NASCET criteria. 1-2 mm eccentric calcific atherosclerosis and intimal thickening of the carotid bulbs. Normal appearance of the included internal carotid arteries.  Left vertebral artery is dominant. Focal symmetric luminal irregularity of the vertebral arteries at the skull base likely secondary to streak artifact from dental amalgam most conspicuous on the axial images.  No hemodynamically significant stenosis by NASCET criteria. No dissection, no pseudoaneurysm. No abnormal luminal irregularity. No contrast extravasation.  Soft tissues are nonsuspicious ; small intra parotid lymph nodes may be reactive. No acute osseous process though bone windows have not been submitted.  Review of the MIP images confirms the above findings.  IMPRESSION: CTA HEAD: Normal CT angiogram of the head ; no acute vascular process or findings of thromboembolism on today's study.  CTA NECK: No acute vascular process; mild atherosclerosis of the carotid bulbs without hemodynamically significant stenosis.  Symmetric irregularity of the vertebral arteries at the skullbase can be attributed to streak artifact from dental amalgam   Electronically Signed   By: Elon Alas   On: 11/01/2014 23:21     EKG Interpretation None      MDM   Final diagnoses:  Vertigo    Patient essentially symptomatically here. Gas him significant relief with Antivert. Has no acute abnormalities on CT angiogram of the neck and head. No acute abdomen on his on CT of head. Seen by neurology, Dr. Doy Mince. She agrees with discharge with normal studies. He is given driving  precautions. Antivert until 25 without symptoms. No driving until 24 hours without symptoms. Primary care follow-up.    Tanna Furry, MD 11/02/14 0000

## 2014-11-01 NOTE — ED Notes (Signed)
Patient here with complaint of dizziness beginning around 1400 today. States that it started suddenly and has been coming and going throughout the day. States that he hadn't worried about it to much until he started to sweat profusely. Symptoms similar to previous stroke presentation. MD Jeneen Rinks informed of patient, came to room, spoke with patient, and advised to activate code stroke. Code stroke communicated to charge RN Santiago Glad for activation. Patient transported to CT by A-pod RN.

## 2014-11-01 NOTE — Discharge Instructions (Signed)
No driving until 24 hours without vertigo.  Take meclizine until 24 hours without symptoms.  Vertigo Vertigo means you feel like you are moving when you are not. Vertigo can make you feel like things around you are moving when they are not. This problem often goes away on its own.  HOME CARE   Follow your doctor's instructions.  Avoid driving.  Avoid using heavy machinery.  Avoid doing any activity that could be dangerous if you have a vertigo attack.  Tell your doctor if a medicine seems to cause your vertigo. GET HELP RIGHT AWAY IF:   Your medicines do not help or make you feel worse.  You have trouble talking or walking.  You feel weak or have trouble using your arms, hands, or legs.  You have bad headaches.  You keep feeling sick to your stomach (nauseous) or throwing up (vomiting).  Your vision changes.  A family member notices changes in your behavior.  Your problems get worse. MAKE SURE YOU:  Understand these instructions.  Will watch your condition.  Will get help right away if you are not doing well or get worse. Document Released: 08/26/2008 Document Revised: 02/09/2012 Document Reviewed: 06/05/2011 [facility] Patient Information 2015 [place], [place]. This information is not intended to replace advice given to you by your health care provider. Make sure you discuss any questions you have with your health care provider.

## 2014-11-01 NOTE — ED Notes (Signed)
Neurologist gave verbal order to cancel code stroke.

## 2014-11-02 DIAGNOSIS — R42 Dizziness and giddiness: Secondary | ICD-10-CM | POA: Insufficient documentation

## 2014-11-02 NOTE — ED Notes (Signed)
Pt a/o x 4 with steady gait on d/c.

## 2015-07-20 ENCOUNTER — Encounter: Payer: Self-pay | Admitting: Internal Medicine

## 2015-07-20 ENCOUNTER — Ambulatory Visit (INDEPENDENT_AMBULATORY_CARE_PROVIDER_SITE_OTHER): Payer: BC Managed Care – PPO | Admitting: Internal Medicine

## 2015-07-20 ENCOUNTER — Other Ambulatory Visit (INDEPENDENT_AMBULATORY_CARE_PROVIDER_SITE_OTHER): Payer: BC Managed Care – PPO

## 2015-07-20 VITALS — BP 138/86 | HR 95 | Temp 98.4°F | Resp 12 | Ht 72.0 in | Wt 212.4 lb

## 2015-07-20 DIAGNOSIS — Z0000 Encounter for general adult medical examination without abnormal findings: Secondary | ICD-10-CM

## 2015-07-20 DIAGNOSIS — Z720 Tobacco use: Secondary | ICD-10-CM

## 2015-07-20 DIAGNOSIS — I10 Essential (primary) hypertension: Secondary | ICD-10-CM | POA: Diagnosis not present

## 2015-07-20 DIAGNOSIS — E785 Hyperlipidemia, unspecified: Secondary | ICD-10-CM

## 2015-07-20 LAB — LIPID PANEL
Cholesterol: 131 mg/dL (ref 0–200)
HDL: 29.1 mg/dL — ABNORMAL LOW (ref >39.00)
LDL Cholesterol: 62 mg/dL (ref 0–99)
NONHDL: 101.96
TRIGLYCERIDES: 200 mg/dL — AB (ref 0.0–149.0)
Total CHOL/HDL Ratio: 5
VLDL: 40 mg/dL (ref 0.0–40.0)

## 2015-07-20 LAB — COMPREHENSIVE METABOLIC PANEL
ALK PHOS: 57 U/L (ref 39–117)
ALT: 29 U/L (ref 0–53)
AST: 22 U/L (ref 0–37)
Albumin: 4.1 g/dL (ref 3.5–5.2)
BILIRUBIN TOTAL: 0.5 mg/dL (ref 0.2–1.2)
BUN: 10 mg/dL (ref 6–23)
CO2: 28 mEq/L (ref 19–32)
Calcium: 9.2 mg/dL (ref 8.4–10.5)
Chloride: 104 mEq/L (ref 96–112)
Creatinine, Ser: 0.99 mg/dL (ref 0.40–1.50)
GFR: 83.46 mL/min (ref >60.00)
Glucose, Bld: 128 mg/dL — ABNORMAL HIGH (ref 70–99)
Potassium: 3.8 mEq/L (ref 3.5–5.1)
SODIUM: 139 meq/L (ref 135–145)
TOTAL PROTEIN: 6.7 g/dL (ref 6.0–8.3)

## 2015-07-20 LAB — HEMOGLOBIN A1C: Hgb A1c MFr Bld: 6.7 % — ABNORMAL HIGH (ref 4.6–6.5)

## 2015-07-20 NOTE — Progress Notes (Signed)
Pre visit review using our clinic review tool, if applicable. No additional management support is needed unless otherwise documented below in the visit note. 

## 2015-07-20 NOTE — Patient Instructions (Signed)
We will check your lab work today and call you back with the results.   Work on getting back into exercise and we will see you back in about 3 months to check on the blood pressure.   Health Maintenance A healthy lifestyle and preventative care can promote health and wellness.  Maintain regular health, dental, and eye exams.  Eat a healthy diet. Foods like vegetables, fruits, whole grains, low-fat dairy products, and lean protein foods contain the nutrients you need and are low in calories. Decrease your intake of foods high in solid fats, added sugars, and salt. Get information about a proper diet from your health care provider, if necessary.  Regular physical exercise is one of the most important things you can do for your health. Most adults should get at least 150 minutes of moderate-intensity exercise (any activity that increases your heart rate and causes you to sweat) each week. In addition, most adults need muscle-strengthening exercises on 2 or more days a week.   Maintain a healthy weight. The body mass index (BMI) is a screening tool to identify possible weight problems. It provides an estimate of body fat based on height and weight. Your health care provider can find your BMI and can help you achieve or maintain a healthy weight. For males 20 years and older:  A BMI below 18.5 is considered underweight.  A BMI of 18.5 to 24.9 is normal.  A BMI of 25 to 29.9 is considered overweight.  A BMI of 30 and above is considered obese.  Maintain normal blood lipids and cholesterol by exercising and minimizing your intake of saturated fat. Eat a balanced diet with plenty of fruits and vegetables. Blood tests for lipids and cholesterol should begin at age 76 and be repeated every 5 years. If your lipid or cholesterol levels are high, you are over age 37, or you are at high risk for heart disease, you may need your cholesterol levels checked more frequently.Ongoing high lipid and cholesterol  levels should be treated with medicines if diet and exercise are not working.  If you smoke, find out from your health care provider how to quit. If you do not use tobacco, do not start.  Lung cancer screening is recommended for adults aged 55-80 years who are at high risk for developing lung cancer because of a history of smoking. A yearly low-dose CT scan of the lungs is recommended for people who have at least a 30-pack-year history of smoking and are current smokers or have quit within the past 15 years. A pack year of smoking is smoking an average of 1 pack of cigarettes a day for 1 year (for example, a 30-pack-year history of smoking could mean smoking 1 pack a day for 30 years or 2 packs a day for 15 years). Yearly screening should continue until the smoker has stopped smoking for at least 15 years. Yearly screening should be stopped for people who develop a health problem that would prevent them from having lung cancer treatment.  If you choose to drink alcohol, do not have more than 2 drinks per day. One drink is considered to be 12 oz (360 mL) of beer, 5 oz (150 mL) of wine, or 1.5 oz (45 mL) of liquor.  Avoid the use of street drugs. Do not share needles with anyone. Ask for help if you need support or instructions about stopping the use of drugs.  High blood pressure causes heart disease and increases the risk of stroke.  Blood pressure should be checked at least every 1-2 years. Ongoing high blood pressure should be treated with medicines if weight loss and exercise are not effective.  If you are 46-35 years old, ask your health care provider if you should take aspirin to prevent heart disease.  Diabetes screening involves taking a blood sample to check your fasting blood sugar level. This should be done once every 3 years after age 59 if you are at a normal weight and without risk factors for diabetes. Testing should be considered at a younger age or be carried out more frequently if you  are overweight and have at least 1 risk factor for diabetes.  Colorectal cancer can be detected and often prevented. Most routine colorectal cancer screening begins at the age of 85 and continues through age 71. However, your health care provider may recommend screening at an earlier age if you have risk factors for colon cancer. On a yearly basis, your health care provider may provide home test kits to check for hidden blood in the stool. A small camera at the end of a tube may be used to directly examine the colon (sigmoidoscopy or colonoscopy) to detect the earliest forms of colorectal cancer. Talk to your health care provider about this at age 68 when routine screening begins. A direct exam of the colon should be repeated every 5-10 years through age 38, unless early forms of precancerous polyps or small growths are found.  People who are at an increased risk for hepatitis B should be screened for this virus. You are considered at high risk for hepatitis B if:  You were born in a country where hepatitis B occurs often. Talk with your health care provider about which countries are considered high risk.  Your parents were born in a high-risk country and you have not received a shot to protect against hepatitis B (hepatitis B vaccine).  You have HIV or AIDS.  You use needles to inject street drugs.  You live with, or have sex with, someone who has hepatitis B.  You are a man who has sex with other men (MSM).  You get hemodialysis treatment.  You take certain medicines for conditions like cancer, organ transplantation, and autoimmune conditions.  Hepatitis C blood testing is recommended for all people born from [birth year] through 1965 and any individual with known risk factors for hepatitis C.  Healthy men should no longer receive prostate-specific antigen (PSA) blood tests as part of routine cancer screening. Talk to your health care provider about prostate cancer screening.  Testicular cancer  screening is not recommended for adolescents or adult males who have no symptoms. Screening includes self-exam, a health care provider exam, and other screening tests. Consult with your health care provider about any symptoms you have or any concerns you have about testicular cancer.  Practice safe sex. Use condoms and avoid high-risk sexual practices to reduce the spread of sexually transmitted infections (STIs).  You should be screened for STIs, including gonorrhea and chlamydia if:  You are sexually active and are younger than 24 years.  You are older than 24 years, and your health care provider tells you that you are at risk for this type of infection.  Your sexual activity has changed since you were last screened, and you are at an increased risk for chlamydia or gonorrhea. Ask your health care provider if you are at risk.  If you are at risk of being infected with HIV, it is recommended that you  take a prescription medicine daily to prevent HIV infection. This is called pre-exposure prophylaxis (PrEP). You are considered at risk if:  You are a man who has sex with other men (MSM).  You are a heterosexual man who is sexually active with multiple partners.  You take drugs by injection.  You are sexually active with a partner who has HIV.  Talk with your health care provider about whether you are at high risk of being infected with HIV. If you choose to begin PrEP, you should first be tested for HIV. You should then be tested every 3 months for as long as you are taking PrEP.  Use sunscreen. Apply sunscreen liberally and repeatedly throughout the day. You should seek shade when your shadow is shorter than you. Protect yourself by wearing long sleeves, pants, a wide-brimmed hat, and sunglasses year round whenever you are outdoors.  Tell your health care provider of new moles or changes in moles, especially if there is a change in shape or color. Also, tell your health care provider if a  mole is larger than the size of a pencil eraser.  A one-time screening for abdominal aortic aneurysm (AAA) and surgical repair of large AAAs by ultrasound is recommended for men aged 29-75 years who are current or former smokers.  Stay current with your vaccines (immunizations). Document Released: 05/15/2008 Document Revised: 11/22/2013 Document Reviewed: 04/14/2011 [facility] Patient Information 2015 [place], [place]. This information is not intended to replace advice given to you by your health care provider. Make sure you discuss any questions you have with your health care provider.

## 2015-07-21 NOTE — Progress Notes (Signed)
   Subjective:    Patient ID: Darrell Brown, male    DOB: 1961/05/01, 54 y.o.   MRN: [id number]  HPI The patient is a 54 YO man here for his blood pressure. He has been off medications for some time due to episode of ischemic colitis and BP had been normal. Since that time he has gained some weight back and is not exercising. He denies headaches, SOB, nausea, confusion. Still smoking and not ready to quit yet.   Review of Systems  Constitutional: Negative for fever, activity change, appetite change, fatigue and unexpected weight change.  HENT: Negative.   Eyes: Negative.   Respiratory: Negative for cough, chest tightness, shortness of breath and wheezing.   Cardiovascular: Negative for chest pain, palpitations and leg swelling.  Gastrointestinal: Negative for nausea, abdominal pain, diarrhea, constipation and abdominal distention.  Musculoskeletal: Negative.   Skin: Negative.   Neurological: Negative.   Psychiatric/Behavioral: Negative.       Objective:   Physical Exam  Constitutional: He is oriented to person, place, and time. He appears well-developed and well-nourished.  Smelling of smoke  HENT:  Head: Normocephalic and atraumatic.  Eyes: EOM are normal.  Neck: Normal range of motion.  Cardiovascular: Normal rate, regular rhythm and intact distal pulses.   No murmur heard. Pulmonary/Chest: Effort normal and breath sounds normal. No respiratory distress. He has no wheezes. He has no rales.  Abdominal: Soft. Bowel sounds are normal. He exhibits no distension. There is no tenderness. There is no rebound.  Musculoskeletal: He exhibits no edema.  Neurological: He is alert and oriented to person, place, and time. Coordination normal.  Skin: Skin is warm and dry.  Psychiatric: He has a normal mood and affect.   Filed Vitals:   07/20/15 1351 07/20/15 1420  BP: 162/100 138/86  Pulse: 95   Temp: 98.4 F (36.9 C)   TempSrc: Oral   Resp: 12   Height: 6' (1.829 m)   Weight: 212 lb  6.4 oz (96.344 kg)   SpO2: 96%       Assessment & Plan:

## 2015-07-21 NOTE — Assessment & Plan Note (Signed)
Taking pravachol 3 times a week due to joint pain with increased dosing. Checking lipid panel today and adjust as needed.

## 2015-07-21 NOTE — Assessment & Plan Note (Signed)
Spoke with him about the fact that his previous stroke makes him high rishk for another and that his blood pressure should be <140/<90. He is willing to try diet and exercise for 3 months and return. If no improvement he is willing to try medication. He accepts his 16% risk of CVD disease. Checking CMP for signs of damage from high BP.

## 2015-07-21 NOTE — Assessment & Plan Note (Signed)
Smoking more lately due to change in position at work. Talked to him about cessation options and he is not ready. Will discuss again in 3 months. Talked to him about how much the smoking increases his risk of heart disease.

## 2015-08-24 ENCOUNTER — Other Ambulatory Visit: Payer: Self-pay | Admitting: Internal Medicine

## 2015-10-19 ENCOUNTER — Ambulatory Visit: Payer: Self-pay | Admitting: Internal Medicine

## 2015-11-29 ENCOUNTER — Other Ambulatory Visit: Payer: Self-pay | Admitting: Internal Medicine

## 2016-02-25 ENCOUNTER — Other Ambulatory Visit: Payer: Self-pay | Admitting: Internal Medicine

## 2016-05-26 ENCOUNTER — Other Ambulatory Visit: Payer: Self-pay | Admitting: Internal Medicine

## 2016-09-07 ENCOUNTER — Other Ambulatory Visit: Payer: Self-pay | Admitting: Internal Medicine

## 2016-12-09 ENCOUNTER — Other Ambulatory Visit (INDEPENDENT_AMBULATORY_CARE_PROVIDER_SITE_OTHER): Payer: 59

## 2016-12-09 ENCOUNTER — Encounter: Payer: Self-pay | Admitting: Internal Medicine

## 2016-12-09 ENCOUNTER — Ambulatory Visit (INDEPENDENT_AMBULATORY_CARE_PROVIDER_SITE_OTHER): Payer: 59 | Admitting: Internal Medicine

## 2016-12-09 VITALS — BP 140/86 | HR 96 | Temp 97.7°F | Resp 14 | Ht 72.0 in | Wt 210.0 lb

## 2016-12-09 DIAGNOSIS — R5383 Other fatigue: Secondary | ICD-10-CM

## 2016-12-09 DIAGNOSIS — Z8673 Personal history of transient ischemic attack (TIA), and cerebral infarction without residual deficits: Secondary | ICD-10-CM | POA: Insufficient documentation

## 2016-12-09 DIAGNOSIS — Z720 Tobacco use: Secondary | ICD-10-CM | POA: Diagnosis not present

## 2016-12-09 DIAGNOSIS — G4700 Insomnia, unspecified: Secondary | ICD-10-CM | POA: Diagnosis not present

## 2016-12-09 LAB — COMPREHENSIVE METABOLIC PANEL
ALK PHOS: 53 U/L (ref 39–117)
ALT: 32 U/L (ref 0–53)
AST: 26 U/L (ref 0–37)
Albumin: 4.2 g/dL (ref 3.5–5.2)
BUN: 9 mg/dL (ref 6–23)
CALCIUM: 9.3 mg/dL (ref 8.4–10.5)
CO2: 28 mEq/L (ref 19–32)
CREATININE: 0.96 mg/dL (ref 0.40–1.50)
Chloride: 101 mEq/L (ref 96–112)
GFR: 86.04 mL/min (ref >60.00)
Glucose, Bld: 148 mg/dL — ABNORMAL HIGH (ref 70–99)
Potassium: 3.9 mEq/L (ref 3.5–5.1)
SODIUM: 137 meq/L (ref 135–145)
TOTAL PROTEIN: 7.1 g/dL (ref 6.0–8.3)
Total Bilirubin: 0.5 mg/dL (ref 0.2–1.2)

## 2016-12-09 LAB — CBC
HCT: 43.6 % (ref 39.0–52.0)
Hemoglobin: 14.9 g/dL (ref 13.0–17.0)
MCHC: 34.2 g/dL (ref 30.0–36.0)
MCV: 89.1 fl (ref 78.0–100.0)
PLATELETS: 285 10*3/uL (ref 150.0–400.0)
RBC: 4.89 Mil/uL (ref 4.22–5.81)
RDW: 14.5 % (ref 11.5–15.5)
WBC: 7 10*3/uL (ref 4.0–10.5)

## 2016-12-09 LAB — VITAMIN D 25 HYDROXY (VIT D DEFICIENCY, FRACTURES): VITD: 15.54 ng/mL — ABNORMAL LOW (ref 30.00–100.00)

## 2016-12-09 LAB — LIPID PANEL
CHOLESTEROL: 122 mg/dL (ref 0–200)
HDL: 32.6 mg/dL — ABNORMAL LOW (ref >39.00)
LDL Cholesterol: 68 mg/dL (ref 0–99)
NonHDL: 89.86
Total CHOL/HDL Ratio: 4
Triglycerides: 109 mg/dL (ref 0.0–149.0)
VLDL: 21.8 mg/dL (ref 0.0–40.0)

## 2016-12-09 LAB — FERRITIN: FERRITIN: 235.1 ng/mL (ref 22.0–322.0)

## 2016-12-09 LAB — TSH: TSH: 1.32 u[IU]/mL (ref 0.35–4.50)

## 2016-12-09 LAB — VITAMIN B12: Vitamin B-12: 123 pg/mL — ABNORMAL LOW (ref 211–911)

## 2016-12-09 LAB — HEMOGLOBIN A1C: Hgb A1c MFr Bld: 7.5 % — ABNORMAL HIGH (ref 4.6–6.5)

## 2016-12-09 NOTE — Progress Notes (Signed)
Pre visit review using our clinic review tool, if applicable. No additional management support is needed unless otherwise documented below in the visit note. 

## 2016-12-09 NOTE — Assessment & Plan Note (Signed)
Suspect some of this is sleep related and stress related. Given the length of the symptoms will evaluate with TSH, CBC, B12, HgA1c, CMP, ferritin, vitamin D.

## 2016-12-09 NOTE — Progress Notes (Signed)
   Subjective:    Patient ID: Darrell Brown, male    DOB: [birth year], 56 y.o.   MRN: [id number]  HPI The patient is a 56 YO man coming in for congestion/fatigue as well as other concerns. He has not tried anything for it. Going on for months at a time. He was sick earlier in December and feels that he always gets sick when on break from his job. His job is very stressful and so when he is off he feels very fatigued and sluggish for the first 5 days from being off.  He is also having problems with sleeping. Able to get to sleep with difficulty but then only sleeps for 1-2 hours, maximum of 4 hours per night, work starts calling him sometimes at 4:30 AM. Has not tried anything for this as well.   Review of Systems  Constitutional: Positive for activity change and fatigue. Negative for appetite change, chills, fever and unexpected weight change.  HENT: Positive for congestion. Negative for ear discharge, ear pain, postnasal drip, rhinorrhea, sinus pain, sinus pressure, sore throat and trouble swallowing.   Eyes: Negative.   Respiratory: Negative.   Cardiovascular: Negative.   Gastrointestinal: Negative.   Musculoskeletal: Negative.   Skin: Negative.   Neurological: Negative.   Psychiatric/Behavioral: Positive for dysphoric mood and sleep disturbance. Negative for agitation, behavioral problems, confusion, decreased concentration, hallucinations, self-injury and suicidal ideas.      Objective:   Physical Exam  Constitutional: He is oriented to person, place, and time. He appears well-developed and well-nourished.  HENT:  Head: Normocephalic and atraumatic.  Right Ear: External ear normal.  Left Ear: External ear normal.  Oropharynx with mild erythema and clear drainage.   Eyes: EOM are normal.  Neck: Normal range of motion.  Cardiovascular: Normal rate and regular rhythm.   Pulmonary/Chest: Effort normal and breath sounds normal.  Abdominal: Soft. He exhibits no distension. There is no  tenderness. There is no rebound.  Lymphadenopathy:    He has no cervical adenopathy.  Neurological: He is alert and oriented to person, place, and time. Coordination normal.  Skin: Skin is warm and dry.  Psychiatric: He has a normal mood and affect.   Vitals:   12/09/16 1043  BP: 140/86  Pulse: 96  Resp: 14  Temp: 97.7 F (36.5 C)  TempSrc: Oral  SpO2: 99%  Weight: 210 lb (95.3 kg)  Height: 6' (1.829 m)      Assessment & Plan:

## 2016-12-09 NOTE — Assessment & Plan Note (Signed)
His wife is concerned given his lack of preventative care. Checking lipid panel, CMP, HgA1c to check for risk factors for stroke. He is still smoking and reminded that this is his biggest risk for more strokes and he wants to quit but feels he cannot try right now.

## 2016-12-09 NOTE — Assessment & Plan Note (Signed)
Reminded that that smoking increases his risk of additional stroke and other health problems. He does not feel able to make an attempt to quit today but is thinking about it and knows it is not good for his health.

## 2016-12-09 NOTE — Assessment & Plan Note (Signed)
Advised to try melatonin over the counter and work on stress reduction. He is not taking time to unwind from the day and this is causing his sleep to be interrupted as well as the fact that his work is calling him in the early hours of the morning.

## 2016-12-09 NOTE — Patient Instructions (Addendum)
You can try zyrtec (also called cetirizine) 1 pill a day for sinuses to help with congestion.  We will check the labs today to make sure nothing else is going on.   Something you can try over the counter for sleep is called melatonin that might help you to sleep better.

## 2016-12-10 ENCOUNTER — Other Ambulatory Visit: Payer: Self-pay | Admitting: Internal Medicine

## 2017-01-20 DIAGNOSIS — D1801 Hemangioma of skin and subcutaneous tissue: Secondary | ICD-10-CM | POA: Diagnosis not present

## 2017-01-20 DIAGNOSIS — L814 Other melanin hyperpigmentation: Secondary | ICD-10-CM | POA: Diagnosis not present

## 2017-01-20 DIAGNOSIS — D225 Melanocytic nevi of trunk: Secondary | ICD-10-CM | POA: Diagnosis not present

## 2017-04-21 ENCOUNTER — Other Ambulatory Visit: Payer: Self-pay | Admitting: Internal Medicine

## 2017-04-21 NOTE — Telephone Encounter (Signed)
Please advise, patient hasn't had routine visit since 2016

## 2017-04-28 ENCOUNTER — Other Ambulatory Visit: Payer: Self-pay | Admitting: Internal Medicine

## 2017-04-29 NOTE — Telephone Encounter (Signed)
Has different provider under Rx

## 2017-08-11 ENCOUNTER — Ambulatory Visit (INDEPENDENT_AMBULATORY_CARE_PROVIDER_SITE_OTHER): Payer: BC Managed Care – PPO | Admitting: Internal Medicine

## 2017-08-11 ENCOUNTER — Encounter: Payer: Self-pay | Admitting: Internal Medicine

## 2017-08-11 DIAGNOSIS — L0292 Furuncle, unspecified: Secondary | ICD-10-CM | POA: Insufficient documentation

## 2017-08-11 MED ORDER — SULFAMETHOXAZOLE-TRIMETHOPRIM 800-160 MG PO TABS: 1.0000 | ORAL_TABLET | Freq: Two times a day (BID) | ORAL | 0 refills | Status: DC

## 2017-08-11 NOTE — Patient Instructions (Signed)
We have sent in bactrim for you. Take 1 pill twice a day for 1 week.   You can use a warm compress on the area twice a day to help it heal faster.

## 2017-08-11 NOTE — Assessment & Plan Note (Signed)
Rx for bactrim, not amenable to drainage today as more firm. If not resolved can pursue I and D.

## 2017-08-11 NOTE — Progress Notes (Signed)
   Subjective:    Patient ID: Darrell Brown, male    DOB: December 06, 1960, 56 y.o.   MRN: [id number]  HPI The patient is a 56 YO man coming in for boil on his neck. Started about 1 week ago and was bigger initially. It is draining some stuff from it. Has gone down slightly. Still tender to touch. Redness over the area since it is rubbing on his shirt but no rash. No fevers or chills. No known allergies. Has had cyst removed in the past from neck. Denies injury to the area. No sinus infection.   Review of Systems  Constitutional: Negative.   Respiratory: Negative.   Cardiovascular: Negative.   Gastrointestinal: Negative.   Musculoskeletal: Negative.   Skin: Positive for wound.      Objective:   Physical Exam  Constitutional: He is oriented to person, place, and time. He appears well-developed and well-nourished.  HENT:  Head: Normocephalic and atraumatic.  Eyes: EOM are normal.  Neck: Normal range of motion.  Cardiovascular: Normal rate and regular rhythm.   Pulmonary/Chest: Effort normal.  Abdominal: Soft.  Musculoskeletal: He exhibits no edema.  Neurological: He is alert and oriented to person, place, and time.  Skin: Skin is warm and dry.  Boil on the left collarbone region. Slightly purulent discharge. More firm.    Vitals:   08/11/17 1059  BP: 140/88  Pulse: 83  Temp: 98.2 F (36.8 C)  TempSrc: Oral  SpO2: 99%  Weight: 205 lb (93 kg)  Height: 6' (1.829 m)      Assessment & Plan:

## 2017-08-21 ENCOUNTER — Ambulatory Visit (INDEPENDENT_AMBULATORY_CARE_PROVIDER_SITE_OTHER): Payer: BC Managed Care – PPO | Admitting: Family Medicine

## 2017-08-21 ENCOUNTER — Other Ambulatory Visit (INDEPENDENT_AMBULATORY_CARE_PROVIDER_SITE_OTHER): Payer: BC Managed Care – PPO

## 2017-08-21 ENCOUNTER — Encounter: Payer: Self-pay | Admitting: Family Medicine

## 2017-08-21 VITALS — BP 128/64 | HR 79 | Temp 98.2°F | Ht 72.0 in | Wt 202.0 lb

## 2017-08-21 DIAGNOSIS — R310 Gross hematuria: Secondary | ICD-10-CM

## 2017-08-21 LAB — URINALYSIS
Bilirubin Urine: NEGATIVE
HGB URINE DIPSTICK: NEGATIVE
Ketones, ur: NEGATIVE
LEUKOCYTES UA: NEGATIVE
Nitrite: NEGATIVE
Specific Gravity, Urine: 1.015 (ref 1.000–1.030)
Total Protein, Urine: NEGATIVE
UROBILINOGEN UA: 0.2 (ref 0.0–1.0)
Urine Glucose: NEGATIVE
pH: 7 (ref 5.0–8.0)

## 2017-08-21 NOTE — Progress Notes (Signed)
Darrell Brown - 56 y.o. male MRN [id number]  Date of birth: 1961/06/18  SUBJECTIVE:  Including CC & ROS.  Chief Complaint  Patient presents with  . Hematuria    Patient is here today C/O visible blood in urine.  On Tuesday night he had some blood in his urine when he was urinating and sneezed.  There was some on his underwear on Wednesday morning.  Has not seen any yesterday or today. States that he has had some discomfort in his right testicle since last night and it feels like it is higher than the left testicle.    [name] is a 56 year old male that was presenting with blood in his urine. He reports earlier this week that he was going to the bathroom before bed. During that time he was voiding he had to sneeze. In order to keep from urinating on the floor he pinched his penis and sneezed. After this blood came out from his penis. He reports there was less blood the next day. He reports there has been no more blood in his urine. He does have some discomfort in his right testicle. He reports a hernia when he was in the fourth grade. He has no pain with urination.     Review of Systems  Constitutional: Negative for fever.  Genitourinary: Positive for hematuria and testicular pain. Negative for dysuria, penile pain and scrotal swelling.  Musculoskeletal: Negative for gait problem.  Skin: Negative for color change.    HISTORY: Past Medical, Surgical, Social, and Family History Reviewed & Updated per EMR.   Pertinent Historical Findings include:  Past Medical History:  Diagnosis Date  . 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 Costochondritis   . Degenerative disc disease   . Diverticulitis   . Familial tremor   . History of chicken pox   . History of low back pain   . History of mumps   . Neural hearing loss, bilateral     Past Surgical History:  Procedure Laterality Date  . cyst removed from neck    . INGUINAL HERNIA REPAIR     Left     No Known  Allergies  Family History  Problem Relation Age of Onset  . Diabetes Mother   . Coronary artery disease Mother   . Arthritis Mother        has had TKR, THR  . Parkinsonism Father   . Mental illness Father   . Stroke Father   . Hypertension Sister   . Coronary artery disease Maternal Grandmother   . Cancer Neg Hx        colon, prostate     Social History   Social History  . Marital status: Married    Spouse name: N/A  . Number of children: 2  . Years of education: 12   Occupational History  . plumbing supervisor [place] History Main Topics  . Smoking status: Current Every Day Smoker    Packs/day: 0.50    Years: 30.00    Types: Cigarettes  . Smokeless tobacco: Never Used     Comment: 10 cigarettes per day  . Alcohol use 0.5 oz/week    1 Standard drinks or equivalent per week  . Drug use: No  . Sexual activity: Yes    Partners: Female   Other Topics Concern  . Not on file   Social History Narrative   HSG. Married '81. Wife with h/o breast  cancer.1 son '85, 1 daughter '88. 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 Life is good     PHYSICAL EXAM:  VS: BP 128/64 (BP Location: Right Arm, Patient Position: Sitting, Cuff Size: Normal)   Pulse 79   Temp 98.2 F (36.8 C) (Oral)   Ht 6' (1.829 m)   Wt 202 lb (91.6 kg)   SpO2 97%   BMI 27.40 kg/m  Physical Exam Gen: NAD, alert, cooperative with exam, well-appearing ENT: normal lips, normal nasal mucosa,  Eye: normal EOM, normal conjunctiva and lids CV:  no edema, +2 pedal pulses   Resp: no accessory muscle use, non-labored,  GU: No obvious hernia, cremasteric reflex intact, some tenderness to palpation of the right testicle, no swollen scrotum, Skin: no rashes, no areas of induration  Neuro: normal tone, normal sensation to touch Psych:  normal insight, alert and oriented MSK: Normal gait, normal strength      ASSESSMENT & PLAN:   Gross hematuria This was after a  traumatic experience. Does not appear to be related to infection or his bladder whatsoever. - Urinalysis today - Advised to follow-up until still having scrotal pain and may need to have an ultrasound performed.

## 2017-08-21 NOTE — Assessment & Plan Note (Signed)
This was after a traumatic experience. Does not appear to be related to infection or his bladder whatsoever. - Urinalysis today - Advised to follow-up until still having scrotal pain and may need to have an ultrasound performed.

## 2017-08-21 NOTE — Patient Instructions (Addendum)
Thank you for coming in,   We will call you with the results from today.   Please let me know if you don't have any improvement next week.    Please feel free to call with any questions or concerns at any time, at [phone]. --Dr. Raeford Razor

## 2017-10-09 ENCOUNTER — Other Ambulatory Visit: Payer: Self-pay | Admitting: Internal Medicine

## 2017-10-12 ENCOUNTER — Encounter: Payer: Self-pay | Admitting: Internal Medicine

## 2017-10-12 ENCOUNTER — Ambulatory Visit (INDEPENDENT_AMBULATORY_CARE_PROVIDER_SITE_OTHER): Payer: BC Managed Care – PPO | Admitting: Internal Medicine

## 2017-10-12 VITALS — BP 118/82 | HR 88 | Temp 98.0°F | Ht 72.0 in | Wt 202.0 lb

## 2017-10-12 DIAGNOSIS — M5441 Lumbago with sciatica, right side: Secondary | ICD-10-CM

## 2017-10-12 DIAGNOSIS — G8929 Other chronic pain: Secondary | ICD-10-CM

## 2017-10-12 MED ORDER — PRAVASTATIN SODIUM 20 MG PO TABS: 20.0000 mg | ORAL_TABLET | ORAL | 0 refills | Status: DC

## 2017-10-12 NOTE — Assessment & Plan Note (Signed)
Ordered MRI lumbar to rule out nerve impingement. Has numbness in right foot and sciatica ongoing for years but worsening in the last 6 months.

## 2017-10-12 NOTE — Progress Notes (Signed)
   Subjective:    Patient ID: Darrell Brown, male    DOB: 28-Sep-1961, 56 y.o.   MRN: [id number]  HPI The patient is a 56 YO man coming in for lower back pain with sciatica down the right leg. Going on for several years now. He has tried PT and taking advil for pain. He has seen sports medicine several years ago and they recommended MRI. He did not pursue this at that time. He is now having more persistent problems and wants to pursue this. He has lost some weight but this did not help.   Review of Systems  Constitutional: Negative for activity change, appetite change, fatigue, fever and unexpected weight change.  Respiratory: Negative.   Cardiovascular: Negative.   Gastrointestinal: Negative.   Musculoskeletal: Positive for back pain. Negative for arthralgias, gait problem and myalgias.  Neurological: Positive for numbness. Negative for dizziness, seizures, facial asymmetry and weakness.       Right 3 toes some decreased sensation  Hematological: Negative.       Objective:   Physical Exam  Constitutional: He is oriented to person, place, and time. He appears well-developed and well-nourished.  HENT:  Head: Normocephalic and atraumatic.  Eyes: EOM are normal.  Neck: Normal range of motion.  Cardiovascular: Normal rate and regular rhythm.  Pulmonary/Chest: Effort normal and breath sounds normal. No respiratory distress. He has no wheezes. He has no rales.  Abdominal: Soft. Bowel sounds are normal. He exhibits no distension. There is no tenderness. There is no rebound.  Musculoskeletal: He exhibits tenderness. He exhibits no edema.  Pain midline and right lumbar with right sciatica  Neurological: He is alert and oriented to person, place, and time. Coordination normal.  Skin: Skin is warm and dry.  Psychiatric: He has a normal mood and affect.   Vitals:   10/12/17 0950  BP: 118/82  Pulse: 88  Temp: 98 F (36.7 C)  TempSrc: Oral  SpO2: 98%  Weight: 202 lb (91.6 kg)  Height: 6'  (1.829 m)      Assessment & Plan:

## 2017-10-12 NOTE — Patient Instructions (Signed)
We will get the MRI ordered and you will be called to scheduled.

## 2017-10-25 ENCOUNTER — Other Ambulatory Visit: Payer: Self-pay | Admitting: Internal Medicine

## 2017-10-25 ENCOUNTER — Ambulatory Visit
Admission: RE | Admit: 2017-10-25 | Discharge: 2017-10-25 | Disposition: A | Discharge status: Home / Self Care | Payer: BC Managed Care – PPO | Source: Ambulatory Visit | Attending: Internal Medicine | Admitting: Internal Medicine

## 2017-10-25 DIAGNOSIS — M48061 Spinal stenosis, lumbar region without neurogenic claudication: Secondary | ICD-10-CM | POA: Diagnosis not present

## 2017-10-25 DIAGNOSIS — M5441 Lumbago with sciatica, right side: Principal | ICD-10-CM

## 2017-10-25 DIAGNOSIS — G8929 Other chronic pain: Secondary | ICD-10-CM

## 2017-10-26 ENCOUNTER — Telehealth: Payer: Self-pay | Admitting: Internal Medicine

## 2017-10-26 DIAGNOSIS — M25569 Pain in unspecified knee: Principal | ICD-10-CM

## 2017-10-26 DIAGNOSIS — G8929 Other chronic pain: Secondary | ICD-10-CM

## 2017-10-26 NOTE — Telephone Encounter (Signed)
Referral placed for him

## 2017-10-26 NOTE — Telephone Encounter (Signed)
Patient called back. He has been informed of the imaging results. He states he does not have a specialist that he sees, he is willing. He would like the nurse to follow up with him. Thank you.

## 2017-11-03 DIAGNOSIS — M25551 Pain in right hip: Secondary | ICD-10-CM | POA: Diagnosis not present

## 2017-11-03 DIAGNOSIS — M25561 Pain in right knee: Secondary | ICD-10-CM | POA: Diagnosis not present

## 2017-11-18 DIAGNOSIS — M545 Low back pain: Secondary | ICD-10-CM | POA: Diagnosis not present

## 2017-11-18 DIAGNOSIS — M5416 Radiculopathy, lumbar region: Secondary | ICD-10-CM | POA: Diagnosis not present

## 2017-12-14 ENCOUNTER — Telehealth: Payer: Self-pay

## 2017-12-14 NOTE — Telephone Encounter (Signed)
Faxed Back with MD response

## 2017-12-14 NOTE — Telephone Encounter (Signed)
Copied from [place] [id number]. Topic: Inquiry >> Dec 14, 2017 11:37 AM Conception Chancy, NT wrote: Reason for CRM: [facility] is calling stating they received the clearance to stop Plavix prior to injection but it does not state how long to stop mediation. Please advise and fax information to [phone]

## 2017-12-14 NOTE — Telephone Encounter (Signed)
That is their decision. I have advised that it is safe to stop if needed. They should decide if they need him off plavix as well as how long they want him off based on the procedure and risk of bleeding.

## 2017-12-23 NOTE — Telephone Encounter (Addendum)
Pt calling to advise th [facility] dr, Dr. Laroy Apple   states they do not make that type of decsion concerning the stopping of the Plavix . Pt states unless he hears otherwise, pt is going to STOP the medication on his own for the 5 days.  Pt states this going back and forth is too much.  Pt phone number [phone]

## 2017-12-30 DIAGNOSIS — M545 Low back pain: Secondary | ICD-10-CM | POA: Diagnosis not present

## 2017-12-30 DIAGNOSIS — M5416 Radiculopathy, lumbar region: Secondary | ICD-10-CM | POA: Diagnosis not present

## 2018-01-01 ENCOUNTER — Encounter: Payer: Self-pay | Admitting: Internal Medicine

## 2018-01-16 ENCOUNTER — Other Ambulatory Visit: Payer: Self-pay | Admitting: Internal Medicine

## 2018-01-20 ENCOUNTER — Other Ambulatory Visit: Payer: Self-pay | Admitting: Internal Medicine

## 2018-01-21 ENCOUNTER — Telehealth: Payer: Self-pay | Admitting: Internal Medicine

## 2018-01-21 DIAGNOSIS — M5416 Radiculopathy, lumbar region: Secondary | ICD-10-CM | POA: Diagnosis not present

## 2018-01-21 DIAGNOSIS — M545 Low back pain: Secondary | ICD-10-CM | POA: Diagnosis not present

## 2018-01-21 MED ORDER — PRAVASTATIN SODIUM 20 MG PO TABS: 20.0000 mg | ORAL_TABLET | ORAL | 0 refills | Status: DC

## 2018-01-21 MED ORDER — CLOPIDOGREL BISULFATE 75 MG PO TABS: 75.0000 mg | ORAL_TABLET | Freq: Every day | ORAL | 0 refills | Status: DC

## 2018-01-21 NOTE — Telephone Encounter (Signed)
noted 

## 2018-01-21 NOTE — Telephone Encounter (Signed)
Copied from [place]. Topic: Quick Communication - Rx Refill/Question >> Jan 21, 2018  9:22 AM Celedonio Savage L wrote: Medication: clopidogrel (PLAVIX) 75 MG tablet   Has the patient contacted their pharmacy? Yes.  Pharmacy told him to call practice to get more refills   (Agent: If no, request that the patient contact the pharmacy for the refill.)   Preferred Pharmacy (with phone number or street name): [id number] - [place], [place]. [address] [phone] (Phone) [phone] (Fax)     Agent: Please be advised that RX refills may take up to 3 business days. We ask that you follow-up with your pharmacy.

## 2018-01-21 NOTE — Telephone Encounter (Signed)
Patient's medications Plavix and Pravachol refilled x 1 month supply, patient made appointment for physical 02/01/18. He verbalized understanding that appointment will need to be kept in order to receive refills on medications and that physical's will need to be scheduled yearly.

## 2018-02-01 ENCOUNTER — Encounter: Payer: Self-pay | Admitting: Internal Medicine

## 2018-02-01 ENCOUNTER — Ambulatory Visit (INDEPENDENT_AMBULATORY_CARE_PROVIDER_SITE_OTHER): Payer: BC Managed Care – PPO | Admitting: Internal Medicine

## 2018-02-01 ENCOUNTER — Other Ambulatory Visit (INDEPENDENT_AMBULATORY_CARE_PROVIDER_SITE_OTHER): Payer: BC Managed Care – PPO

## 2018-02-01 VITALS — BP 140/90 | HR 92 | Temp 97.9°F | Ht 72.0 in | Wt 209.0 lb

## 2018-02-01 DIAGNOSIS — Z1159 Encounter for screening for other viral diseases: Secondary | ICD-10-CM

## 2018-02-01 DIAGNOSIS — E785 Hyperlipidemia, unspecified: Secondary | ICD-10-CM | POA: Diagnosis not present

## 2018-02-01 DIAGNOSIS — E119 Type 2 diabetes mellitus without complications: Secondary | ICD-10-CM | POA: Diagnosis not present

## 2018-02-01 DIAGNOSIS — Z0000 Encounter for general adult medical examination without abnormal findings: Secondary | ICD-10-CM | POA: Diagnosis not present

## 2018-02-01 DIAGNOSIS — Z8673 Personal history of transient ischemic attack (TIA), and cerebral infarction without residual deficits: Secondary | ICD-10-CM | POA: Diagnosis not present

## 2018-02-01 DIAGNOSIS — I10 Essential (primary) hypertension: Secondary | ICD-10-CM

## 2018-02-01 DIAGNOSIS — Z720 Tobacco use: Secondary | ICD-10-CM | POA: Diagnosis not present

## 2018-02-01 LAB — COMPREHENSIVE METABOLIC PANEL
ALT: 30 U/L (ref 0–53)
AST: 26 U/L (ref 0–37)
Albumin: 4 g/dL (ref 3.5–5.2)
Alkaline Phosphatase: 57 U/L (ref 39–117)
BILIRUBIN TOTAL: 0.5 mg/dL (ref 0.2–1.2)
BUN: 6 mg/dL (ref 6–23)
CO2: 30 meq/L (ref 19–32)
CREATININE: 0.99 mg/dL (ref 0.40–1.50)
Calcium: 9.6 mg/dL (ref 8.4–10.5)
Chloride: 103 mEq/L (ref 96–112)
GFR: 82.7 mL/min (ref >60.00)
GLUCOSE: 89 mg/dL (ref 70–99)
Potassium: 4.1 mEq/L (ref 3.5–5.1)
Sodium: 139 mEq/L (ref 135–145)
Total Protein: 7.2 g/dL (ref 6.0–8.3)

## 2018-02-01 LAB — LIPID PANEL
CHOL/HDL RATIO: 4
Cholesterol: 121 mg/dL (ref 0–200)
HDL: 32.5 mg/dL — AB (ref >39.00)
LDL Cholesterol: 67 mg/dL (ref 0–99)
NONHDL: 88.87
Triglycerides: 109 mg/dL (ref 0.0–149.0)
VLDL: 21.8 mg/dL (ref 0.0–40.0)

## 2018-02-01 LAB — CBC
HCT: 45.1 % (ref 39.0–52.0)
Hemoglobin: 15.3 g/dL (ref 13.0–17.0)
MCHC: 34 g/dL (ref 30.0–36.0)
MCV: 90.8 fl (ref 78.0–100.0)
Platelets: 282 10*3/uL (ref 150.0–400.0)
RBC: 4.97 Mil/uL (ref 4.22–5.81)
RDW: 13.9 % (ref 11.5–15.5)
WBC: 7.1 10*3/uL (ref 4.0–10.5)

## 2018-02-01 LAB — HEMOGLOBIN A1C: Hgb A1c MFr Bld: 7.5 % — ABNORMAL HIGH (ref 4.6–6.5)

## 2018-02-01 LAB — MICROALBUMIN / CREATININE URINE RATIO
CREATININE, U: 60.1 mg/dL
Microalb Creat Ratio: 1.2 mg/g (ref 0.0–30.0)
Microalb, Ur: <0.7 mg/dL (ref 0.0–1.9)

## 2018-02-01 NOTE — Assessment & Plan Note (Signed)
Checking HgA1c, lipid panel. He will switch to aspirin 81 mg daily from his plavix. He informs me that he was not taking aspirin prior to stroke in 2009.

## 2018-02-01 NOTE — Patient Instructions (Addendum)
We are going to get you into the nutritionist.   Think about getting the pneumonia shot when you come back.  We are checking the labs today and will call you back with the results.   Stop taking plavix and start taking a baby aspirin 81 mg daily.    Diabetes Mellitus and Standards of Medical Care Managing diabetes (diabetes mellitus) can be complicated. Your diabetes treatment may be managed by a team of health care providers, including:  A diet and nutrition specialist (registered dietitian).  A nurse.  A certified diabetes educator (CDE).  A diabetes specialist (endocrinologist).  An eye doctor.  A primary care provider.  A dentist.  Your health care providers follow a schedule in order to help you get the best quality of care. The following schedule is a general guideline for your diabetes management plan. Your health care providers may also give you more specific instructions. HbA1c ( hemoglobin A1c) test This test provides information about blood sugar (glucose) control over the previous 2-3 months. It is used to check whether your diabetes management plan needs to be adjusted.  If you are meeting your treatment goals, this test is done at least 2 times a year.  If you are not meeting treatment goals or if your treatment goals have changed, this test is done 4 times a year.  Blood pressure test  This test is done at every routine medical visit. For most people, the goal is less than 130/80. Ask your health care provider what your goal blood pressure should be. Dental and eye exams  Visit your dentist two times a year.  If you have type 1 diabetes, get an eye exam 3-5 years after you are diagnosed, and then once a year after your first exam. ? If you were diagnosed with type 1 diabetes as a child, get an eye exam when you are age 54 or older and have had diabetes for 3-5 years. After the first exam, you should get an eye exam once a year.  If you have type 2 diabetes,  have an eye exam as soon as you are diagnosed, and then once a year after your first exam. Foot care exam  Visual foot exams are done at every routine medical visit. The exams check for cuts, bruises, redness, blisters, sores, or other problems with the feet.  A complete foot exam is done by your health care provider once a year. This exam includes an inspection of the structure and skin of your feet, and a check of the pulses and sensation in your feet. ? Type 1 diabetes: Get your first exam 3-5 years after diagnosis. ? Type 2 diabetes: Get your first exam as soon as you are diagnosed.  Check your feet every day for cuts, bruises, redness, blisters, or sores. If you have any of these or other problems that are not healing, contact your health care provider. Kidney function test ( urine microalbumin)  This test is done once a year. ? Type 1 diabetes: Get your first test 5 years after diagnosis. ? Type 2 diabetes: Get your first test as soon as you are diagnosed.  If you have chronic kidney disease (CKD), get a serum creatinine and estimated glomerular filtration rate (eGFR) test once a year. Lipid profile (cholesterol, HDL, LDL, triglycerides)  This test should be done when you are diagnosed with diabetes, and every 5 years after the first test. If you are on medicines to lower your cholesterol, you may need to  get this test done every year. ? The goal for LDL is less than 100 mg/dL (5.5 mmol/L). If you are at high risk, the goal is less than 70 mg/dL (3.9 mmol/L). ? The goal for HDL is 40 mg/dL (2.2 mmol/L) for men and 50 mg/dL(2.8 mmol/L) for women. An HDL cholesterol of 60 mg/dL (3.3 mmol/L) or higher gives some protection against heart disease. ? The goal for triglycerides is less than 150 mg/dL (8.3 mmol/L). Immunizations  The yearly flu (influenza) vaccine is recommended for everyone 6 months or older who has diabetes.  The pneumonia (pneumococcal) vaccine is recommended for  everyone 2 years or older who has diabetes. If you are 85 or older, you may get the pneumonia vaccine as a series of two separate shots.  The hepatitis B vaccine is recommended for adults shortly after they have been diagnosed with diabetes.  The Tdap (tetanus, diphtheria, and pertussis) vaccine should be given: ? According to normal childhood vaccination schedules, for children. ? Every 10 years, for adults who have diabetes.  The shingles vaccine is recommended for people who have had chicken pox and are 50 years or older. Mental and emotional health  Screening for symptoms of eating disorders, anxiety, and depression is recommended at the time of diagnosis and afterward as needed. If your screening shows that you have symptoms (you have a positive screening result), you may need further evaluation and be referred to a mental health care provider. Diabetes self-management education  Education about how to manage your diabetes is recommended at diagnosis and ongoing as needed. Treatment plan  Your treatment plan will be reviewed at every medical visit. Summary  Managing diabetes (diabetes mellitus) can be complicated. Your diabetes treatment may be managed by a team of health care providers.  Your health care providers follow a schedule in order to help you get the best quality of care.  Standards of care including having regular physical exams, blood tests, blood pressure monitoring, immunizations, screening tests, and education about how to manage your diabetes.  Your health care providers may also give you more specific instructions based on your individual health. This information is not intended to replace advice given to you by your health care provider. Make sure you discuss any questions you have with your health care provider. Document Released: 09/14/2009 Document Revised: 08/15/2016 Document Reviewed: 08/15/2016 Elsevier Interactive Patient Education  2018 [place] Maintenance, Male A healthy lifestyle and preventive care is important for your health and wellness. Ask your health care provider about what schedule of regular examinations is right for you. What should I know about weight and diet? Eat a Healthy Diet  Eat plenty of vegetables, fruits, whole grains, low-fat dairy products, and lean protein.  Do not eat a lot of foods high in solid fats, added sugars, or salt.  Maintain a Healthy Weight Regular exercise can help you achieve or maintain a healthy weight. You should:  Do at least 150 minutes of exercise each week. The exercise should increase your heart rate and make you sweat (moderate-intensity exercise).  Do strength-training exercises at least twice a week.  Watch Your Levels of Cholesterol and Blood Lipids  Have your blood tested for lipids and cholesterol every 5 years starting at 58 years of age. If you are at high risk for heart disease, you should start having your blood tested when you are 58 years old. You may need to have your cholesterol levels checked more often if: ?  Your lipid or cholesterol levels are high. ? You are older than 58 years of age. ? You are at high risk for heart disease.  What should I know about cancer screening? Many types of cancers can be detected early and may often be prevented. Lung Cancer  You should be screened every year for lung cancer if: ? You are a current smoker who has smoked for at least 30 years. ? You are a former smoker who has quit within the past 15 years.  Talk to your health care provider about your screening options, when you should start screening, and how often you should be screened.  Colorectal Cancer  Routine colorectal cancer screening usually begins at 58 years of age and should be repeated every 5-10 years until you are 58 years old. You may need to be screened more often if early forms of precancerous polyps or small growths are found. Your health  care provider may recommend screening at an earlier age if you have risk factors for colon cancer.  Your health care provider may recommend using home test kits to check for hidden blood in the stool.  A small camera at the end of a tube can be used to examine your colon (sigmoidoscopy or colonoscopy). This checks for the earliest forms of colorectal cancer.  Prostate and Testicular Cancer  Depending on your age and overall health, your health care provider may do certain tests to screen for prostate and testicular cancer.  Talk to your health care provider about any symptoms or concerns you have about testicular or prostate cancer.  Skin Cancer  Check your skin from head to toe regularly.  Tell your health care provider about any new moles or changes in moles, especially if: ? There is a change in a mole's size, shape, or color. ? You have a mole that is larger than a pencil eraser.  Always use sunscreen. Apply sunscreen liberally and repeat throughout the day.  Protect yourself by wearing long sleeves, pants, a wide-brimmed hat, and sunglasses when outside.  What should I know about heart disease, diabetes, and high blood pressure?  If you are 77-21 years of age, have your blood pressure checked every 3-5 years. If you are 32 years of age or older, have your blood pressure checked every year. You should have your blood pressure measured twice-once when you are at a hospital or clinic, and once when you are not at a hospital or clinic. Record the average of the two measurements. To check your blood pressure when you are not at a hospital or clinic, you can use: ? An automated blood pressure machine at a pharmacy. ? A home blood pressure monitor.  Talk to your health care provider about your target blood pressure.  If you are between 5-86 years old, ask your health care provider if you should take aspirin to prevent heart disease.  Have regular diabetes screenings by checking your  fasting blood sugar level. ? If you are at a normal weight and have a low risk for diabetes, have this test once every three years after the age of 28. ? If you are overweight and have a high risk for diabetes, consider being tested at a younger age or more often.  A one-time screening for abdominal aortic aneurysm (AAA) by ultrasound is recommended for men aged 53-75 years who are current or former smokers. What should I know about preventing infection? Hepatitis B If you have a higher risk for hepatitis B,  you should be screened for this virus. Talk with your health care provider to find out if you are at risk for hepatitis B infection. Hepatitis C Blood testing is recommended for:  Everyone born from [birth year] through 1966.  Anyone with known risk factors for hepatitis C.  Sexually Transmitted Diseases (STDs)  You should be screened each year for STDs including gonorrhea and chlamydia if: ? You are sexually active and are younger than 58 years of age. ? You are older than 58 years of age and your health care provider tells you that you are at risk for this type of infection. ? Your sexual activity has changed since you were last screened and you are at an increased risk for chlamydia or gonorrhea. Ask your health care provider if you are at risk.  Talk with your health care provider about whether you are at high risk of being infected with HIV. Your health care provider may recommend a prescription medicine to help prevent HIV infection.  What else can I do?  Schedule regular health, dental, and eye exams.  Stay current with your vaccines (immunizations).  Do not use any tobacco products, such as cigarettes, chewing tobacco, and e-cigarettes. If you need help quitting, ask your health care provider.  Limit alcohol intake to no more than 2 drinks per day. One drink equals 12 ounces of beer, 5 ounces of wine, or 1 ounces of hard liquor.  Do not use street drugs.  Do not share  needles.  Ask your health care provider for help if you need support or information about quitting drugs.  Tell your health care provider if you often feel depressed.  Tell your health care provider if you have ever been abused or do not feel safe at home. This information is not intended to replace advice given to you by your health care provider. Make sure you discuss any questions you have with your health care provider. Document Released: 05/15/2008 Document Revised: 07/16/2016 Document Reviewed: 08/21/2015 Elsevier Interactive Patient Education  [employer].

## 2018-02-01 NOTE — Assessment & Plan Note (Signed)
Time spent counseling about tobacco usage: 3 minutes. I have asked about smoking and is smoking same as usual. The patient is advised to quit. The patient is not willing to quit. They would like to try to quit in the next 6 months. We will follow up with them in 6 months. 

## 2018-02-01 NOTE — Assessment & Plan Note (Signed)
Taking pravachol and checking lipid panel for goal LDL <70.

## 2018-02-01 NOTE — Assessment & Plan Note (Signed)
Declines medication at this time. BP is above goal of <130/80

## 2018-02-01 NOTE — Assessment & Plan Note (Signed)
Never returned after last year being diagnosed with diabetes on labs. Is taking statin, not on ACE-I or ARB. Foot exam done. Eye exam 6 months ago, need to get records. Currently diet controlled, referral to diabetes education for dietary changes. Drinks a lot of mountain dew and advised to cut back or off this.

## 2018-02-01 NOTE — Progress Notes (Signed)
   Subjective:    Patient ID: Darrell Brown, male    DOB: 1961-10-24, 57 y.o.   MRN: [id number]  HPI The patient is a 57 YO man coming in for physical.   PMH, [facility], social history reviewed and updated.   Review of Systems  Constitutional: Negative.   HENT: Negative.   Eyes: Negative.   Respiratory: Negative for cough, chest tightness and shortness of breath.   Cardiovascular: Negative for chest pain, palpitations and leg swelling.  Gastrointestinal: Negative for abdominal distention, abdominal pain, constipation, diarrhea, nausea and vomiting.  Musculoskeletal: Positive for back pain. Negative for arthralgias, gait problem, myalgias and neck stiffness.  Skin: Negative.   Neurological: Negative.   Psychiatric/Behavioral: Negative.       Objective:   Physical Exam  Constitutional: He is oriented to person, place, and time. He appears well-developed and well-nourished.  HENT:  Head: Normocephalic and atraumatic.  Eyes: EOM are normal.  Neck: Normal range of motion.  Cardiovascular: Normal rate and regular rhythm.  Pulmonary/Chest: Effort normal and breath sounds normal. No respiratory distress. He has no wheezes. He has no rales.  Abdominal: Soft. Bowel sounds are normal. He exhibits no distension. There is no tenderness. There is no rebound.  Musculoskeletal: He exhibits no edema.  Neurological: He is alert and oriented to person, place, and time. Coordination normal.  Skin: Skin is warm and dry.  See foot exam  Psychiatric: He has a normal mood and affect.   Vitals:   02/01/18 1301  BP: 140/90  Pulse: 92  Temp: 97.9 F (36.6 C)  TempSrc: Oral  SpO2: 93%  Weight: 209 lb (94.8 kg)  Height: 6' (1.829 m)      Assessment & Plan:

## 2018-02-01 NOTE — Assessment & Plan Note (Signed)
Declines tetanus or pneumonia shot today. Added to shingrix waiting list. Declines flu shot. Foot exam done today. Needs colonoscopy and he will schedule. Counseled on sun safety and mole surveillance. Given screening recommendations.

## 2018-02-02 LAB — HEPATITIS C ANTIBODY
HEP C AB: NONREACTIVE
SIGNAL TO CUT-OFF: 0.05 (ref <1.00)

## 2018-02-23 ENCOUNTER — Encounter: Payer: BC Managed Care – PPO | Attending: Internal Medicine | Admitting: *Deleted

## 2018-02-23 DIAGNOSIS — E119 Type 2 diabetes mellitus without complications: Secondary | ICD-10-CM | POA: Diagnosis not present

## 2018-02-23 DIAGNOSIS — Z713 Dietary counseling and surveillance: Secondary | ICD-10-CM | POA: Diagnosis not present

## 2018-02-23 NOTE — Patient Instructions (Signed)
Plan:  Aim for 3-4 Carb Choices per meal (45-60 grams)  Aim for 0-2 Carbs per snack if hungry  Include protein in moderation with your meals and snacks Consider reading food labels for Total Carbohydrate and Fat Grams of foods Consider  increasing your activity level daily as tolerated Consider getting a meter or Elenor Legato so you can check BG at alternate times per day a couple of days a week

## 2018-02-25 NOTE — Progress Notes (Signed)
Diabetes Self-Management Education  Visit Type: First/Initial  Appt. Start Time: 1530  Appt. End Time: 1700  02/25/2018  Mr. Darrell Brown, identified by name and date of birth, is a 57 y.o. male with a diagnosis of Diabetes: Type 2. He works as Geophysical data processor for [employer]. Diet history obtained. He is not checking his BG as he states his MD has not recommended it yet.   ASSESSMENT  There were no vitals taken for this visit. There is no height or weight on file to calculate BMI.  Diabetes Self-Management Education - 02/23/18 1543      Visit Information   Visit Type  First/Initial      Initial Visit   Diabetes Type  Type 2    Are you currently following a meal plan?  No    Are you taking your medications as prescribed?  Not on Medications    Date Diagnosed  doesnt know      Health Coping   How would you rate your overall health?  Good      Psychosocial Assessment   Other persons present  Patient    Patient Concerns  Nutrition/Meal planning;Glycemic Control    Special Needs  None    How often do you need to have someone help you when you read instructions, pamphlets, or other written materials from your doctor or pharmacy?  1 - Never    What is the last grade level you completed in school?  82  Plumber foreman for [employer]      Pre-Education Assessment   Patient understands the diabetes disease and treatment process.  Needs Instruction    Patient understands incorporating nutritional management into lifestyle.  Needs Instruction    Patient undertands incorporating physical activity into lifestyle.  Needs Review    Patient understands monitoring blood glucose, interpreting and using results  Needs Instruction    Patient understands prevention, detection, and treatment of chronic complications.  Needs Review    Patient understands how to develop strategies to address psychosocial issues.  Needs Review    Patient understands how to develop strategies  to promote health/change behavior.  Needs Review      Complications   Last HgB A1C per patient/outside source  7.5 %    How often do you check your blood sugar?  Not recommended by provider    Have you had a dilated eye exam in the past 12 months?  Yes    Are you checking your feet?  No      Dietary Intake   Breakfast  skipped his whole life    Snack (morning)  candy bar infrequently    Lunch  small burger, (no fries),     Snack (afternoon)  not usually    Dinner  meat, vegetables occaisonally, starch, (no bread)    Snack (evening)  2 candy bars most nights (Hershey bar with almonds) a few nights a week    Beverage(s)  regular soda at each meal, starting to drink some water      Exercise   Exercise Type  ADL's physically active with job      Patient Education   Previous Diabetes Education  No    Disease state   Definition of diabetes, type 1 and 2, and the diagnosis of diabetes    Nutrition management   Carbohydrate counting;Role of diet in the treatment of diabetes and the relationship between the three main macronutrients and blood glucose level;Food label reading, portion sizes  and measuring food.    Physical activity and exercise   Role of exercise on diabetes management, blood pressure control and cardiac health.    Monitoring  Purpose and frequency of SMBG.;Identified appropriate SMBG and/or A1C goals.    Chronic complications  Relationship between chronic complications and blood glucose control    Psychosocial adjustment  Role of stress on diabetes      Individualized Goals (developed by patient)   Nutrition  Follow meal plan discussed    Physical Activity  Exercise 3-5 times per week    Medications  Not Applicable    Monitoring   Other (comment) Consider the benefits of having BG infor available to you       Post-Education Assessment   Patient understands the diabetes disease and treatment process.  Demonstrates understanding / competency    Patient understands  incorporating nutritional management into lifestyle.  Demonstrates understanding / competency    Patient undertands incorporating physical activity into lifestyle.  Demonstrates understanding / competency      Outcomes   Expected Outcomes  Demonstrated interest in learning. Expect positive outcomes    Future DMSE  PRN    Program Status  Completed       Individualized Plan for Diabetes Self-Management Training:   Learning Objective:  Patient will have a greater understanding of diabetes self-management. Patient education plan is to attend individual and/or group sessions per assessed needs and concerns.   Plan:   Patient Instructions  Plan:  Aim for 3-4 Carb Choices per meal (45-60 grams)  Aim for 0-2 Carbs per snack if hungry  Include protein in moderation with your meals and snacks Consider reading food labels for Total Carbohydrate and Fat Grams of foods Consider  increasing your activity level daily as tolerated Consider getting a meter or Elenor Legato so you can check BG at alternate times per day a couple of days a week   Expected Outcomes:  Demonstrated interest in learning. Expect positive outcomes  Education material provided: Living Well with Diabetes, A1C conversion sheet, Meal plan card and Carbohydrate counting sheet  If problems or questions, patient to contact team via:  Phone  Future DSME appointment: PRN

## 2018-02-26 DIAGNOSIS — L814 Other melanin hyperpigmentation: Secondary | ICD-10-CM | POA: Diagnosis not present

## 2018-02-26 DIAGNOSIS — D225 Melanocytic nevi of trunk: Secondary | ICD-10-CM | POA: Diagnosis not present

## 2018-02-26 DIAGNOSIS — D1801 Hemangioma of skin and subcutaneous tissue: Secondary | ICD-10-CM | POA: Diagnosis not present

## 2018-02-26 DIAGNOSIS — L570 Actinic keratosis: Secondary | ICD-10-CM | POA: Diagnosis not present

## 2018-03-06 ENCOUNTER — Other Ambulatory Visit: Payer: Self-pay | Admitting: Internal Medicine

## 2018-03-19 IMAGING — MR MR LUMBAR SPINE W/O CM
5 series · 47 of 48 positions shown · non-contrast
Comparison: Lumbar spine radiographs 09/08/2012

CLINICAL DATA: Low back pain. Right sciatica. Intermittent symptoms
for 10 years.

EXAM:
MRI LUMBAR SPINE WITHOUT CONTRAST
TECHNIQUE: Multiplanar, multisequence MR imaging of the lumbar spine was
performed. No intravenous contrast was administered.

[Series 3: T2 · sagittal · 4.0mm · 0.94mm/px · 6 of 17 slices shown (1 of 2)]
[im 1/17]
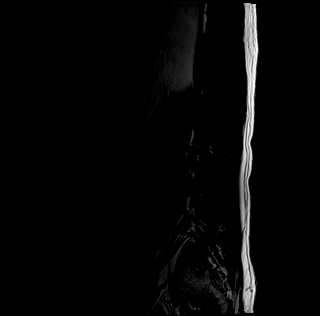
[im 4/17]
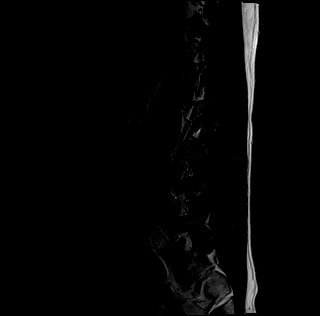
[im 7/17]
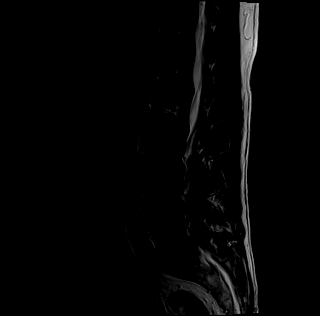
[im 10/17]
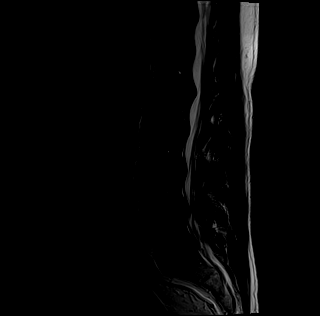
[im 13/17]
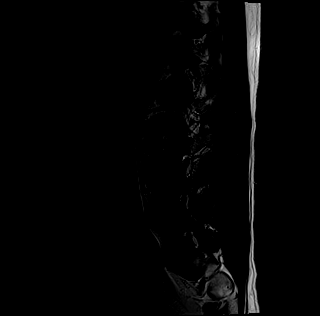
[im 17/17]
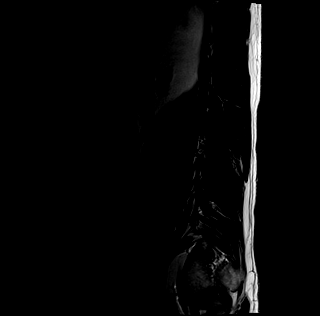

[Series 4: tirm sag · sagittal · 4.0mm · 0.59mm/px · 5 of 17 slices shown]
[im 1/17]
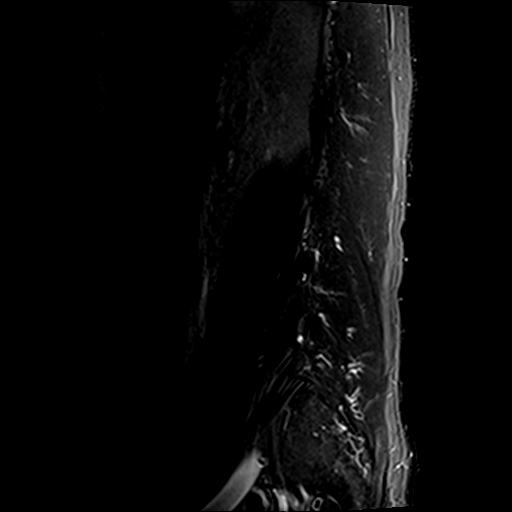
[im 5/17]
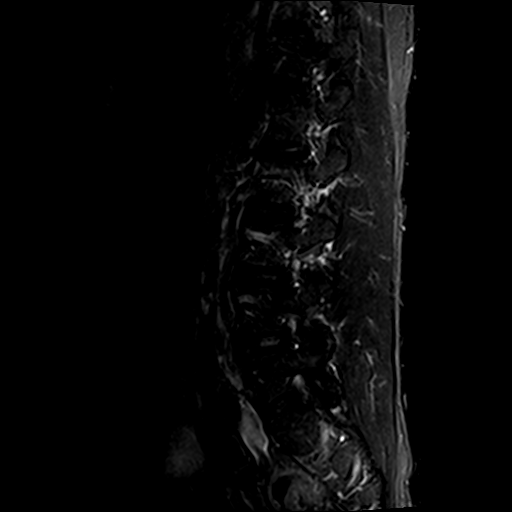
[im 9/17]
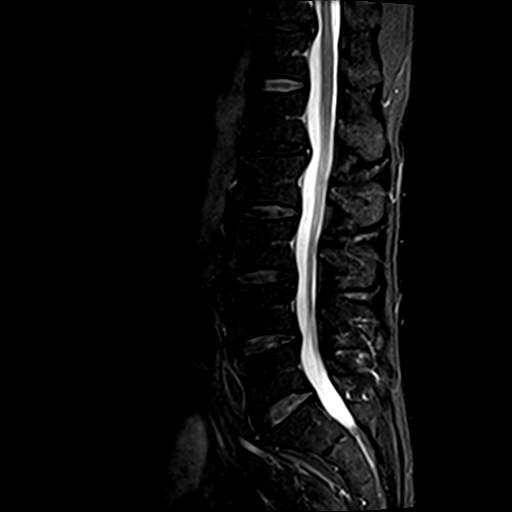
[im 13/17]
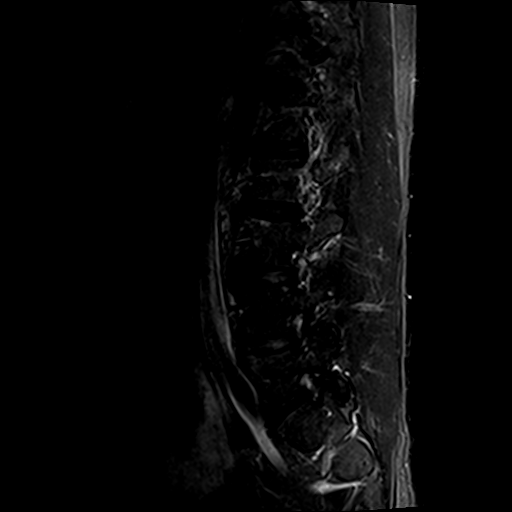
[im 17/17]
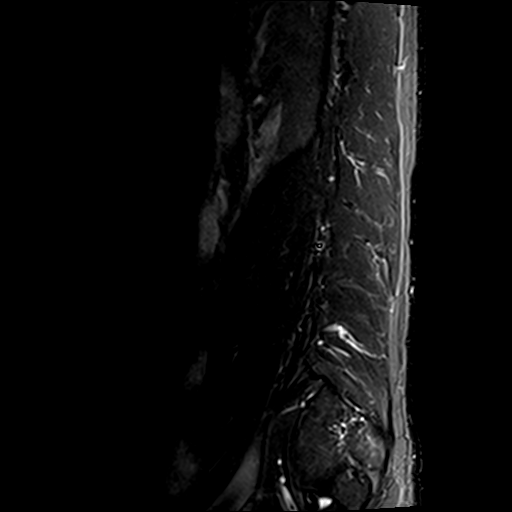

[Series 5: T1 · sagittal · 4.0mm · 0.94mm/px · 5 of 17 slices shown (1 of 2)]
[im 1/17]
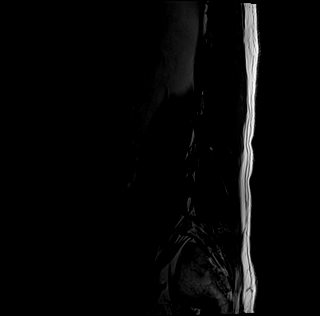
[im 5/17]
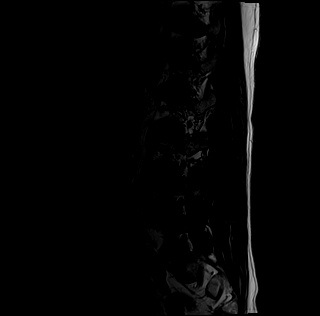
[im 9/17]
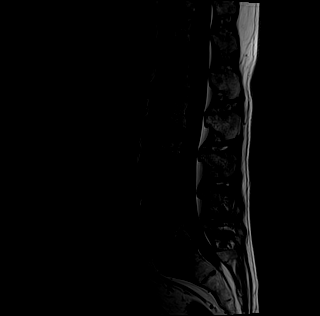
[im 13/17]
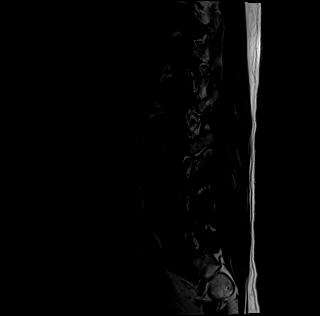
[im 17/17]
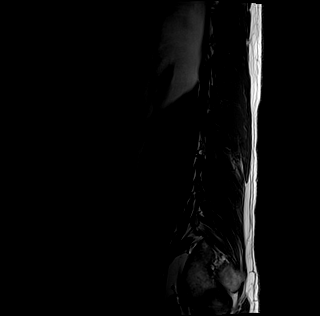

[Series 6: T1 · axial · 4.0mm · 0.70mm/px · z∈[-50,+230]mm · 15 of 54 slices shown (2 of 2)]
[im 1/54]
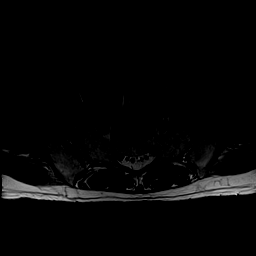
[im 4/54]
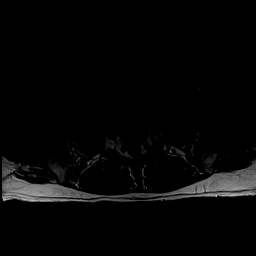
[im 8/54]
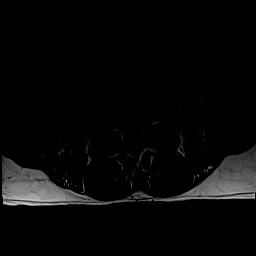
[im 11/54]
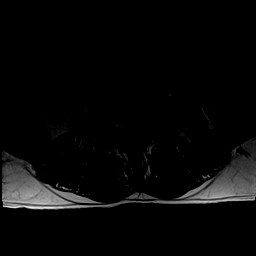
[im 15/54]
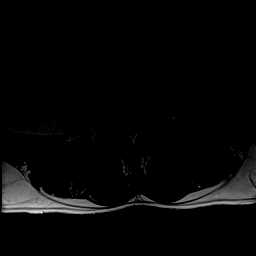
[im 18/54]
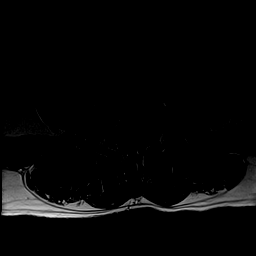
[im 22/54]
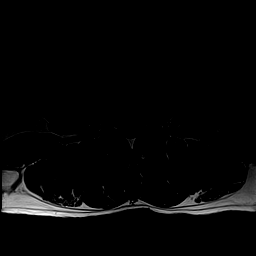
[im 25/54]
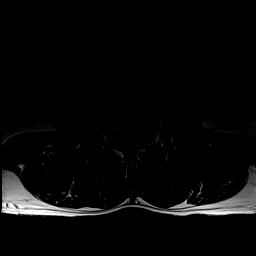
[im 29/54]
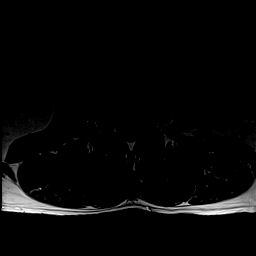
[im 32/54]
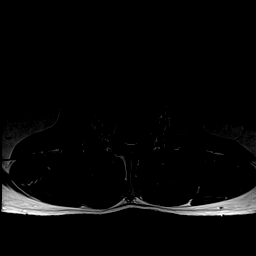
[im 36/54]
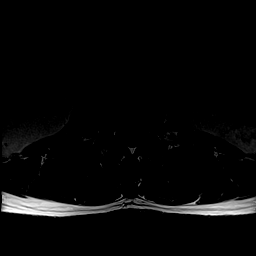
[im 39/54]
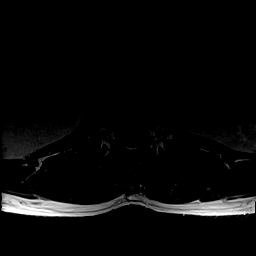
[im 43/54]
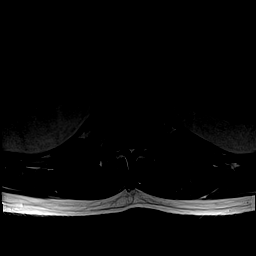
[im 46/54]
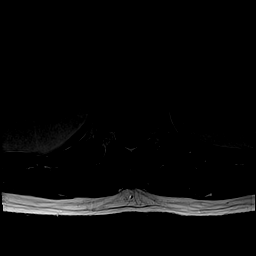
[im 54/54]
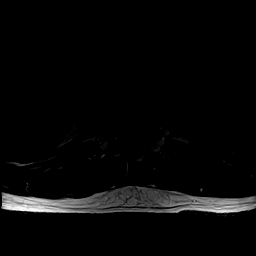

[Series 7: T2 · axial · 4.0mm · 0.70mm/px · z∈[-50,+230]mm · 16 of 54 slices shown (2 of 2)]
[im 1/54]
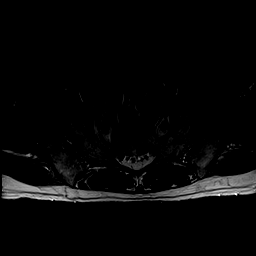
[im 4/54]
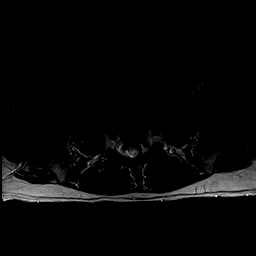
[im 8/54]
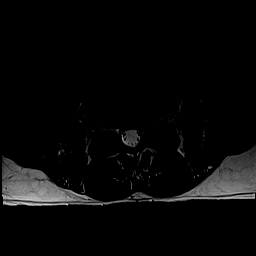
[im 11/54]
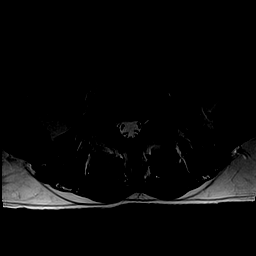
[im 15/54]
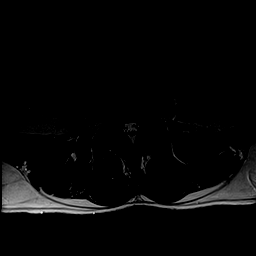
[im 18/54]
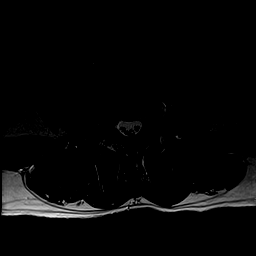
[im 22/54]
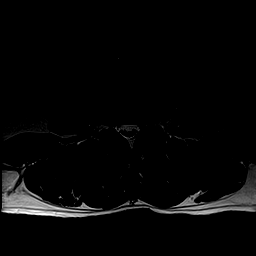
[im 25/54]
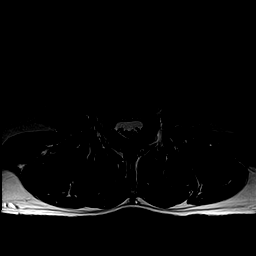
[im 29/54]
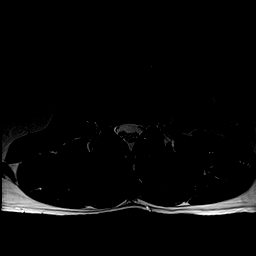
[im 32/54]
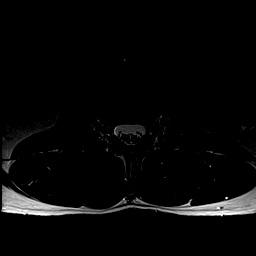
[im 36/54]
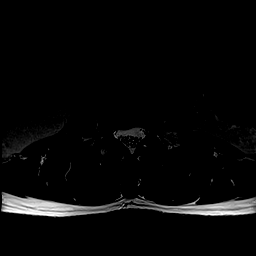
[im 39/54]
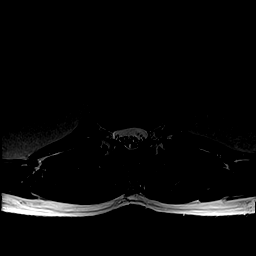
[im 43/54]
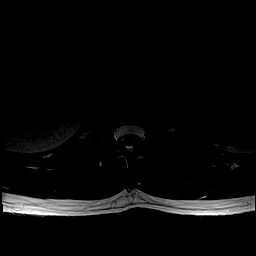
[im 46/54]
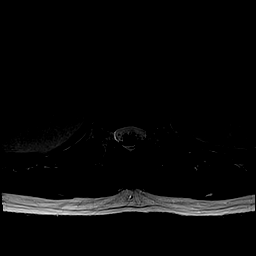
[im 50/54]
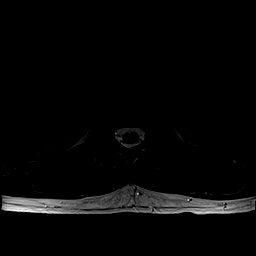
[im 54/54]
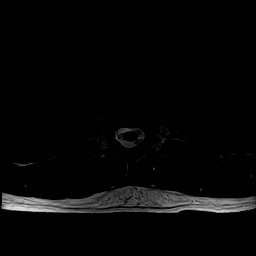

[47 of 48 positions shown; findings below may reference images not displayed]

FINDINGS: Segmentation:  Standard.

Alignment:  Normal.

Vertebrae: No fracture, osseous lesion, or significant marrow edema.

Conus medullaris and cauda equina: Conus extends to the L1-2 level.
Conus and cauda equina appear normal.

Paraspinal and other soft tissues: Absent right kidney.

Disc levels:

Disc desiccation predominantly at T11-12 and L1-2 where there is
mild disc space narrowing, with milder desiccation from L2-3 to
L4-5.

T11-12:  Small central disc extrusion without stenosis.

T12-L1:  Negative.

L1-2: Mild disc bulging and small right paracentral disc protrusion
with annular fissure without stenosis.

L2-3:  Minimal disc bulging without stenosis.

L3-4: Mild disc bulging and mild facet hypertrophy without stenosis.

L4-5: Circumferential disc bulging and mild-to-moderate facet and
ligamentum flavum hypertrophy result in mild bilateral lateral
recess stenosis and borderline right and mild left neural foraminal
stenosis. No significant spinal stenosis.

L5-S1: Minimal disc bulging and mild facet hypertrophy result in
borderline bilateral neural foraminal stenosis without spinal
stenosis.
IMPRESSION: 1. Mild lumbar spondylosis, most notable at L4-5 where there is mild
lateral recess and neural foraminal stenosis.
2. Small disc herniations at T11-12 and L1-2 without stenosis.

## 2018-03-30 ENCOUNTER — Telehealth: Payer: Self-pay

## 2018-03-30 NOTE — Telephone Encounter (Signed)
Left message asking patient to call back to get first shingrix injection, needs to make nurse visit---if appt is made, let tamara,RN at [facility] know so that both vaccines can be labeled

## 2018-04-12 NOTE — Telephone Encounter (Signed)
Pt called back and scheduled nurse visit on the 15th @ 9:15, pt also asked he has a bump on top of head that burns, pt asked if he needs to wait to get the shot, call pt if needed

## 2018-04-13 ENCOUNTER — Encounter: Payer: Self-pay | Admitting: Internal Medicine

## 2018-04-13 ENCOUNTER — Ambulatory Visit (INDEPENDENT_AMBULATORY_CARE_PROVIDER_SITE_OTHER): Payer: BC Managed Care – PPO | Admitting: Internal Medicine

## 2018-04-13 VITALS — BP 138/96 | HR 86 | Temp 98.3°F | Ht 72.0 in | Wt 204.0 lb

## 2018-04-13 DIAGNOSIS — R21 Rash and other nonspecific skin eruption: Secondary | ICD-10-CM

## 2018-04-13 DIAGNOSIS — Z23 Encounter for immunization: Secondary | ICD-10-CM

## 2018-04-13 NOTE — Progress Notes (Signed)
   Subjective:    Patient ID: Darrell Brown, male    DOB: 10/03/1961, 57 y.o.   MRN: [id number]  HPI The patient is a 57 YO man coming in for bumps on his head. Has had shingles in the past possibly and wants to make sure this is not the case. He had some bumps several years ago and was told by his hairdresser it could be shingles. They felt warm and he could not see them. He then went to an urgent care and they treated him for shingles. He has a bump near the hairline on the left side. Started 1-2 days ago. Denies fevers or chills. Denies pain in his scalp. Maybe mild burning sensation.   Review of Systems  Constitutional: Negative.   Respiratory: Negative for cough, chest tightness and shortness of breath.   Cardiovascular: Negative for chest pain, palpitations and leg swelling.  Gastrointestinal: Negative for abdominal distention, abdominal pain, constipation, diarrhea, nausea and vomiting.  Musculoskeletal: Negative.   Skin: Positive for wound.  Neurological: Negative.   Psychiatric/Behavioral: Negative.       Objective:   Physical Exam  Constitutional: He is oriented to person, place, and time. He appears well-developed and well-nourished.  HENT:  Head: Normocephalic and atraumatic.  Eyes: EOM are normal.  Neck: Normal range of motion.  Cardiovascular: Normal rate and regular rhythm.  Pulmonary/Chest: Effort normal and breath sounds normal. No respiratory distress. He has no wheezes. He has no rales.  Abdominal: Soft. Bowel sounds are normal. He exhibits no distension. There is no tenderness. There is no rebound.  Musculoskeletal: He exhibits no edema.  Neurological: He is alert and oriented to person, place, and time. Coordination normal.  Skin: Skin is warm and dry.  1 maculopapular lesion on the hairline which does not appear to be shingles and is not tender to the touch.    Vitals:   04/13/18 1601  BP: (!) 138/96  Pulse: 86  Temp: 98.3 F (36.8 C)  TempSrc: Oral  SpO2:  95%  Weight: 204 lb (92.5 kg)  Height: 6' (1.829 m)      Assessment & Plan:  Shingrix IM given at visit

## 2018-04-13 NOTE — Patient Instructions (Signed)
We have given you the shingles vaccine today. Come back in about 2-4 months for the second one.

## 2018-04-14 ENCOUNTER — Ambulatory Visit: Payer: Self-pay

## 2018-04-15 DIAGNOSIS — R21 Rash and other nonspecific skin eruption: Secondary | ICD-10-CM | POA: Insufficient documentation

## 2018-04-15 NOTE — Assessment & Plan Note (Signed)
Bumps on the scalp do not appear to be shingles and it is unclear from the story if the past episode was shingles. He is okay to get shingrix and first given at visit.

## 2018-05-31 ENCOUNTER — Other Ambulatory Visit: Payer: Self-pay | Admitting: Internal Medicine

## 2018-06-11 DIAGNOSIS — J069 Acute upper respiratory infection, unspecified: Secondary | ICD-10-CM | POA: Diagnosis not present

## 2018-06-18 ENCOUNTER — Telehealth: Payer: Self-pay | Admitting: Family Medicine

## 2018-06-18 ENCOUNTER — Other Ambulatory Visit (INDEPENDENT_AMBULATORY_CARE_PROVIDER_SITE_OTHER): Payer: BC Managed Care – PPO

## 2018-06-18 ENCOUNTER — Ambulatory Visit: Payer: BC Managed Care – PPO

## 2018-06-18 ENCOUNTER — Ambulatory Visit (INDEPENDENT_AMBULATORY_CARE_PROVIDER_SITE_OTHER): Payer: BC Managed Care – PPO | Admitting: Family Medicine

## 2018-06-18 ENCOUNTER — Encounter: Payer: Self-pay | Admitting: Family Medicine

## 2018-06-18 VITALS — BP 118/76 | HR 82 | Temp 97.7°F | Ht 72.0 in | Wt 187.0 lb

## 2018-06-18 DIAGNOSIS — J0110 Acute frontal sinusitis, unspecified: Secondary | ICD-10-CM | POA: Diagnosis not present

## 2018-06-18 LAB — CBC WITH DIFFERENTIAL/PLATELET
Basophils Absolute: 0 10*3/uL (ref 0.0–0.1)
Basophils Relative: 0.3 % (ref 0.0–3.0)
EOS PCT: 1.4 % (ref 0.0–5.0)
Eosinophils Absolute: 0.1 10*3/uL (ref 0.0–0.7)
HEMATOCRIT: 51.8 % (ref 39.0–52.0)
HEMOGLOBIN: 17.4 g/dL — AB (ref 13.0–17.0)
LYMPHS PCT: 15.7 % (ref 12.0–46.0)
Lymphs Abs: 1.6 10*3/uL (ref 0.7–4.0)
MCHC: 33.7 g/dL (ref 30.0–36.0)
MCV: 90.8 fl (ref 78.0–100.0)
MONOS PCT: 7.4 % (ref 3.0–12.0)
Monocytes Absolute: 0.8 10*3/uL (ref 0.1–1.0)
Neutro Abs: 7.8 10*3/uL — ABNORMAL HIGH (ref 1.4–7.7)
Neutrophils Relative %: 75.2 % (ref 43.0–77.0)
Platelets: 364 10*3/uL (ref 150.0–400.0)
RBC: 5.71 Mil/uL (ref 4.22–5.81)
RDW: 13.5 % (ref 11.5–15.5)
WBC: 10.3 10*3/uL (ref 4.0–10.5)

## 2018-06-18 LAB — COMPREHENSIVE METABOLIC PANEL
ALBUMIN: 4.2 g/dL (ref 3.5–5.2)
ALK PHOS: 63 U/L (ref 39–117)
ALT: 23 U/L (ref 0–53)
AST: 13 U/L (ref 0–37)
BUN: 16 mg/dL (ref 6–23)
CO2: 28 mEq/L (ref 19–32)
Calcium: 9.4 mg/dL (ref 8.4–10.5)
Chloride: 98 mEq/L (ref 96–112)
Creatinine, Ser: 1.21 mg/dL (ref 0.40–1.50)
GFR: 65.51 mL/min (ref >60.00)
Glucose, Bld: 169 mg/dL — ABNORMAL HIGH (ref 70–99)
POTASSIUM: 4.1 meq/L (ref 3.5–5.1)
Sodium: 133 mEq/L — ABNORMAL LOW (ref 135–145)
TOTAL PROTEIN: 7.3 g/dL (ref 6.0–8.3)
Total Bilirubin: 0.6 mg/dL (ref 0.2–1.2)

## 2018-06-18 NOTE — Patient Instructions (Addendum)
Good to see you  Please try things such as zyrtec-D or allegra-D which is an antihistamine and decongestant.   Please try afrin which will help with nasal congestion but use for only three days.   Please also try using a netti pot on a regular occasion.  Honey can help with a sore throat.   I will call you with the results from today

## 2018-06-18 NOTE — Telephone Encounter (Signed)
Spoke with patient about results.   Rosemarie Ax, MD [facility] 06/18/2018, 5:08 PM

## 2018-06-18 NOTE — Progress Notes (Signed)
Darrell Brown - 57 y.o. male MRN [id number]  Date of birth: 08-31-1961  SUBJECTIVE:  Including CC & ROS.  Chief Complaint  Patient presents with  . Sinus pressure    Darrell Brown is a 57 y.o. male that is presenting with sinus pressure. Symptoms have been ongoing for two weeks. He was seen at Urgent care in [place] on 06/11/18 he completed a course of prednisone and azithromycin on 06/15/18. He feels like his symptoms have not improved. Denies fevers. Admits to headaches and feeling dizzy. He has been not been taking anything for his symptoms. He has not been around anyone with similar symptoms. No urinary complaints.     Review of Systems  Constitutional: Positive for activity change. Negative for fever.  HENT: Positive for sinus pressure.   Neurological: Positive for dizziness.    HISTORY: Past Medical, Surgical, Social, and Family History Reviewed & Updated per EMR.   Pertinent Historical Findings include:  Past Medical History:  Diagnosis Date  . 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 Costochondritis   . Degenerative disc disease   . Diverticulitis   . Familial tremor   . History of chicken pox   . History of low back pain   . History of mumps   . Neural hearing loss, bilateral     Past Surgical History:  Procedure Laterality Date  . cyst removed from neck    . INGUINAL HERNIA REPAIR     Left     No Known Allergies  Family History  Problem Relation Age of Onset  . Diabetes Mother   . Coronary artery disease Mother   . Arthritis Mother        has had TKR, THR  . Parkinsonism Father   . Mental illness Father   . Stroke Father   . Hypertension Sister   . Coronary artery disease Maternal Grandmother   . Cancer Neg Hx        colon, prostate     Social History   Socioeconomic History  . Marital status: Married    Spouse name: Not on file  . Number of children: 2  . Years of education: 53  . Highest education level: Not on file    Occupational History  . Occupation: Tourist information centre manager: [employer]  . Financial resource strain: Not on file  . Food insecurity:    Worry: Not on file    Inability: Not on file  . Transportation needs:    Medical: Not on file    Non-medical: Not on file  Tobacco Use  . Smoking status: Current Every Day Smoker    Packs/day: 0.50    Years: 30.00    Pack years: 15.00    Types: Cigarettes  . Smokeless tobacco: Never Used  . Tobacco comment: 10 cigarettes per day  Substance and Sexual Activity  . Alcohol use: Yes    Alcohol/week: 0.6 oz    Types: 1 Standard drinks or equivalent per week  . Drug use: No  . Sexual activity: Yes    Partners: Female  Lifestyle  . Physical activity:    Days per week: Not on file    Minutes per session: Not on file  . Stress: Not on file  Relationships  . Social connections:    Talks on phone: Not on file    Gets together: Not on file    Attends religious service: Not  on file    Active member of club or organization: Not on file    Attends meetings of clubs or organizations: Not on file    Relationship status: Not on file  . Intimate partner violence:    Fear of current or ex partner: Not on file    Emotionally abused: Not on file    Physically abused: Not on file    Forced sexual activity: Not on file  Other Topics Concern  . Not on file  Social History Narrative   HSG. Married '81. Wife with h/o breast cancer.1 son '85, 1 daughter '88. 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 Life is good     PHYSICAL EXAM:  VS: BP 118/76 (BP Location: Left Arm, Patient Position: Sitting, Cuff Size: Normal)   Pulse 82   Temp 97.7 F (36.5 C) (Oral)   Ht 6' (1.829 m)   Wt 187 lb (84.8 kg)   SpO2 96%   BMI 25.36 kg/m  Physical Exam Gen: NAD, alert, cooperative with exam,  ENT: normal lips, normal nasal mucosa, tympanic membranes clear and intact bilaterally, normal oropharynx, Eye: normal  EOM, normal conjunctiva and lids CV:  no edema, +2 pedal pulses, regular rate and rhythm, S1-S2   Resp: no accessory muscle use, non-labored, clear to auscultation bilaterally, no crackles or wheezes Skin: no rashes, no areas of induration  Neuro: normal tone, normal sensation to touch Psych:  normal insight, alert and oriented MSK: Normal gait, normal strength       ASSESSMENT & PLAN:   Acute non-recurrent frontal sinusitis Having malaise and lightheadedness with the sinus pressure. Completed a course of antibiotics and steroid with limited improvement.  - cbc and cmp  - counseled on supportive care  - given indications to follow up - if no improvement consider augmentin, chest xray

## 2018-06-21 DIAGNOSIS — J0110 Acute frontal sinusitis, unspecified: Secondary | ICD-10-CM | POA: Insufficient documentation

## 2018-06-21 NOTE — Assessment & Plan Note (Addendum)
Having malaise and lightheadedness with the sinus pressure. Completed a course of antibiotics and steroid with limited improvement.  - cbc and cmp  - counseled on supportive care  - given indications to follow up - if no improvement consider augmentin, chest xray

## 2018-08-05 ENCOUNTER — Ambulatory Visit (INDEPENDENT_AMBULATORY_CARE_PROVIDER_SITE_OTHER): Payer: BC Managed Care – PPO | Admitting: Internal Medicine

## 2018-08-05 ENCOUNTER — Encounter: Payer: Self-pay | Admitting: Internal Medicine

## 2018-08-05 VITALS — BP 122/84 | HR 90 | Temp 98.3°F | Ht 72.0 in | Wt 197.0 lb

## 2018-08-05 DIAGNOSIS — Z23 Encounter for immunization: Secondary | ICD-10-CM | POA: Diagnosis not present

## 2018-08-05 DIAGNOSIS — Z720 Tobacco use: Secondary | ICD-10-CM | POA: Diagnosis not present

## 2018-08-05 DIAGNOSIS — E1169 Type 2 diabetes mellitus with other specified complication: Secondary | ICD-10-CM

## 2018-08-05 DIAGNOSIS — E1159 Type 2 diabetes mellitus with other circulatory complications: Secondary | ICD-10-CM | POA: Diagnosis not present

## 2018-08-05 DIAGNOSIS — E785 Hyperlipidemia, unspecified: Secondary | ICD-10-CM

## 2018-08-05 LAB — POCT GLYCOSYLATED HEMOGLOBIN (HGB A1C): Hemoglobin A1C: 6.7 % — AB (ref 4.0–5.6)

## 2018-08-05 NOTE — Assessment & Plan Note (Addendum)
Diet controlled currently. Checking HgA1c in the office today and was 6.7. Complicated by hyperlipidemia and past stroke. Taking statin. No change to regimen today and will reassess in 6 months.

## 2018-08-05 NOTE — Progress Notes (Signed)
   Subjective:    Patient ID: Darrell Brown, male    DOB: 1961/03/17, 57 y.o.   MRN: [id number]  HPI The patient is a 57 YO man coming in for follow up of diabetes. He is not taking medication for this currently. Was going to work on dietary changes previously. He did meet with a nutritionist. He stopped drinking mountain dew and lost about 10 pounds in the last 6 months. He is feeling well overall. Denies numbness or tingling in his feet or arms. Denies chest pains or SOB. Denies abdominal pain. Denies new stroke symptoms.   Review of Systems  Constitutional: Negative.   HENT: Negative.   Eyes: Negative.   Respiratory: Negative for cough, chest tightness and shortness of breath.   Cardiovascular: Negative for chest pain, palpitations and leg swelling.  Gastrointestinal: Negative for abdominal distention, abdominal pain, constipation, diarrhea, nausea and vomiting.  Musculoskeletal: Negative.   Skin: Negative.   Neurological: Negative.   Psychiatric/Behavioral: Negative.       Objective:   Physical Exam  Constitutional: He is oriented to person, place, and time. He appears well-developed and well-nourished.  HENT:  Head: Normocephalic and atraumatic.  Eyes: EOM are normal.  Neck: Normal range of motion.  Cardiovascular: Normal rate and regular rhythm.  Pulmonary/Chest: Effort normal and breath sounds normal. No respiratory distress. He has no wheezes. He has no rales.  Abdominal: Soft. Bowel sounds are normal. He exhibits no distension. There is no tenderness. There is no rebound.  Musculoskeletal: He exhibits no edema.  Neurological: He is alert and oriented to person, place, and time. Coordination normal.  Skin: Skin is warm and dry.  Psychiatric: He has a normal mood and affect.   Vitals:   08/05/18 1539  BP: 122/84  Pulse: 90  Temp: 98.3 F (36.8 C)  TempSrc: Oral  SpO2: 96%  Weight: 197 lb (89.4 kg)  Height: 6' (1.829 m)      Assessment & Plan:  Shingrix IM given  at visit

## 2018-08-05 NOTE — Assessment & Plan Note (Signed)
Taking pravastatin every other day. LDL goal <70.

## 2018-08-05 NOTE — Patient Instructions (Signed)
Your HgA1c is 6.7 which is better and at goal. Keep up the good work with diet.

## 2018-12-06 ENCOUNTER — Other Ambulatory Visit: Payer: Self-pay | Admitting: Internal Medicine

## 2019-02-16 ENCOUNTER — Ambulatory Visit: Payer: Self-pay | Admitting: Internal Medicine

## 2019-02-16 NOTE — Telephone Encounter (Signed)
Pt called in and told the agent he had "chest tightness".   However after triaging him he is experiencing a sore throat and "tightness below my throat especially when I wake up in the morning".   "My throat is sore too.   "I'm having drainage down the back of my throat from my sinuses".    No fever, cough or shortness of breath.  See triage notes.    Home care advice given with instructions to call back if not improved with the Allegra after 2-3 days or develops fever or cough develops.     He verbalized understanding and was agreeable to this paln.  Reason for Disposition . [1] Nasal allergies AND [9] only certain times of year (hay fever)  Answer Assessment - Initial Assessment Questions 1.CATION: "Where does it hurt?"       Tight below my throat.   I'm taking Advil and Sudafed.    When I wake up in the morning I feel the tightness.   My throat really sore.   It's been sore for a week now.   No fever.  Coughing very little.   My sinuses are draining down my throat.    That's causing my throat to be sore.   I have a history of allergies.    I can try Allegra.     No shortness of breath.    My boss came back from [place] 2 wks ago but he has not been sick.    2. RADIATION: "Does the pain go anywhere else?" (e.g., into neck, jaw, arms, back)     *No Answer* 3. ONSET: "When did the chest pain begin?" (Minutes, hours or days)      *No Answer* 4. PATTERN "Does the pain come and go, or has it been constant since it started?"  "Does it get worse with exertion?"      *No Answer* 5. DURATION: "How long does it last" (e.g., seconds, minutes, hours)     *No Answer* 6. SEVERITY: "How bad is the pain?"  (e.g., Scale 1-10; mild, moderate, or severe)    - MILD (1-3): doesn't interfere with normal activities     - MODERATE (4-7): interferes with normal activities or awakens from sleep    - SEVERE (8-10): excruciating pain, unable to do any normal activities       *No Answer* 7. CARDIAC RISK FACTORS:  "Do you have any history of heart problems or risk factors for heart disease?" (e.g., prior heart attack, angina; high blood pressure, diabetes, being overweight, high cholesterol, smoking, or strong family history of heart disease)     *No Answer* 8. PULMONARY RISK FACTORS: "Do you have any history of lung disease?"  (e.g., blood clots in lung, asthma, emphysema, birth control pills)     *No Answer* 9. CAUSE: "What do you think is causing the chest pain?"     *No Answer* 10. OTHER SYMPTOMS: "Do you have any other symptoms?" (e.g., dizziness, nausea, vomiting, sweating, fever, difficulty breathing, cough)       *No Answer* 11. PREGNANCY: "Is there any chance you are pregnant?" "When was your last menstrual period?"       *No Answer*  Protocols used: NASAL ALLERGIES (HAY FEVER)-A-AH, CHEST PAIN-A-AH

## 2019-08-31 ENCOUNTER — Other Ambulatory Visit: Payer: Self-pay | Admitting: *Deleted

## 2019-08-31 MED ORDER — PRAVASTATIN SODIUM 20 MG PO TABS: 20.0000 mg | ORAL_TABLET | ORAL | 0 refills | Status: DC

## 2019-09-06 ENCOUNTER — Encounter: Payer: Self-pay | Admitting: Internal Medicine

## 2019-09-06 ENCOUNTER — Other Ambulatory Visit (INDEPENDENT_AMBULATORY_CARE_PROVIDER_SITE_OTHER): Payer: BC Managed Care – PPO

## 2019-09-06 ENCOUNTER — Other Ambulatory Visit: Payer: Self-pay

## 2019-09-06 ENCOUNTER — Ambulatory Visit (INDEPENDENT_AMBULATORY_CARE_PROVIDER_SITE_OTHER): Payer: BC Managed Care – PPO | Admitting: Internal Medicine

## 2019-09-06 VITALS — BP 136/100 | HR 94 | Temp 98.5°F | Ht 72.0 in | Wt 200.0 lb

## 2019-09-06 DIAGNOSIS — Z0000 Encounter for general adult medical examination without abnormal findings: Secondary | ICD-10-CM

## 2019-09-06 DIAGNOSIS — I10 Essential (primary) hypertension: Secondary | ICD-10-CM

## 2019-09-06 DIAGNOSIS — E1159 Type 2 diabetes mellitus with other circulatory complications: Secondary | ICD-10-CM | POA: Diagnosis not present

## 2019-09-06 DIAGNOSIS — Z720 Tobacco use: Secondary | ICD-10-CM

## 2019-09-06 DIAGNOSIS — Z23 Encounter for immunization: Secondary | ICD-10-CM | POA: Diagnosis not present

## 2019-09-06 DIAGNOSIS — E785 Hyperlipidemia, unspecified: Secondary | ICD-10-CM

## 2019-09-06 DIAGNOSIS — E1169 Type 2 diabetes mellitus with other specified complication: Secondary | ICD-10-CM

## 2019-09-06 DIAGNOSIS — Z8673 Personal history of transient ischemic attack (TIA), and cerebral infarction without residual deficits: Secondary | ICD-10-CM

## 2019-09-06 LAB — COMPREHENSIVE METABOLIC PANEL
ALT: 19 U/L (ref 0–53)
AST: 18 U/L (ref 0–37)
Albumin: 4.3 g/dL (ref 3.5–5.2)
Alkaline Phosphatase: 56 U/L (ref 39–117)
BUN: 7 mg/dL (ref 6–23)
CO2: 29 mEq/L (ref 19–32)
Calcium: 9.5 mg/dL (ref 8.4–10.5)
Chloride: 102 mEq/L (ref 96–112)
Creatinine, Ser: 0.99 mg/dL (ref 0.40–1.50)
GFR: 77.37 mL/min (ref >60.00)
Glucose, Bld: 101 mg/dL — ABNORMAL HIGH (ref 70–99)
Potassium: 3.8 mEq/L (ref 3.5–5.1)
Sodium: 137 mEq/L (ref 135–145)
Total Bilirubin: 0.5 mg/dL (ref 0.2–1.2)
Total Protein: 7.1 g/dL (ref 6.0–8.3)

## 2019-09-06 LAB — MICROALBUMIN / CREATININE URINE RATIO
Creatinine,U: 24.3 mg/dL
Microalb Creat Ratio: 2.9 mg/g (ref 0.0–30.0)
Microalb, Ur: <0.7 mg/dL (ref 0.0–1.9)

## 2019-09-06 LAB — LIPID PANEL
Cholesterol: 134 mg/dL (ref 0–200)
HDL: 33.6 mg/dL — ABNORMAL LOW (ref >39.00)
LDL Cholesterol: 78 mg/dL (ref 0–99)
NonHDL: 100.53
Total CHOL/HDL Ratio: 4
Triglycerides: 114 mg/dL (ref 0.0–149.0)
VLDL: 22.8 mg/dL (ref 0.0–40.0)

## 2019-09-06 LAB — CBC
HCT: 47.8 % (ref 39.0–52.0)
Hemoglobin: 15.7 g/dL (ref 13.0–17.0)
MCHC: 32.9 g/dL (ref 30.0–36.0)
MCV: 92.8 fl (ref 78.0–100.0)
Platelets: 295 10*3/uL (ref 150.0–400.0)
RBC: 5.15 Mil/uL (ref 4.22–5.81)
RDW: 13.9 % (ref 11.5–15.5)
WBC: 8.1 10*3/uL (ref 4.0–10.5)

## 2019-09-06 LAB — HEMOGLOBIN A1C: Hgb A1c MFr Bld: 7 % — ABNORMAL HIGH (ref 4.6–6.5)

## 2019-09-06 NOTE — Assessment & Plan Note (Signed)
Still smoking, declines to quit at this time.

## 2019-09-06 NOTE — Assessment & Plan Note (Signed)
He declines medication today, home readings he states are okay. Given past stroke he is at high risk for recurrence.

## 2019-09-06 NOTE — Assessment & Plan Note (Signed)
Asked to resume statin. Checking lipid panel and adjust as needed.

## 2019-09-06 NOTE — Assessment & Plan Note (Signed)
BP elevated today and he states normal at home, not on meds currently.

## 2019-09-06 NOTE — Assessment & Plan Note (Signed)
Foot exam done, reminded about eye exam. Checking HgA1c and lipid and microalbumin to creatinine ratio. Diet controlled currently and not on ACE-I/ARB or statin.

## 2019-09-06 NOTE — Progress Notes (Signed)
   Subjective:   Patient ID: Darrell Brown, male    DOB: 04-30-1961, 58 y.o.   MRN: [id number]  HPI The patient is a 58 YO man coming in for physical. No recent eye exam.  PMH, Price, social history reviewed and updated  Review of Systems  Constitutional: Negative.   HENT: Negative.   Eyes: Negative.   Respiratory: Negative for cough, chest tightness and shortness of breath.   Cardiovascular: Negative for chest pain, palpitations and leg swelling.  Gastrointestinal: Negative for abdominal distention, abdominal pain, constipation, diarrhea, nausea and vomiting.  Musculoskeletal: Negative.   Skin: Negative.   Neurological: Negative.   Psychiatric/Behavioral: Negative.     Objective:  Physical Exam Constitutional:      Appearance: He is well-developed.  HENT:     Head: Normocephalic and atraumatic.  Neck:     Musculoskeletal: Normal range of motion.  Cardiovascular:     Rate and Rhythm: Normal rate and regular rhythm.  Pulmonary:     Effort: Pulmonary effort is normal. No respiratory distress.     Breath sounds: Normal breath sounds. No wheezing or rales.  Abdominal:     General: Bowel sounds are normal. There is no distension.     Palpations: Abdomen is soft.     Tenderness: There is no abdominal tenderness. There is no rebound.  Skin:    General: Skin is warm and dry.     Comments: Foot exam done  Neurological:     Mental Status: He is alert and oriented to person, place, and time.     Coordination: Coordination normal.     Vitals:   09/06/19 1313  BP: (!) 136/100  Pulse: 94  Temp: 98.5 F (36.9 C)  TempSrc: Oral  SpO2: 96%  Weight: 200 lb (90.7 kg)  Height: 6' (1.829 m)    Assessment & Plan:  Tdap and flu given at visit

## 2019-09-06 NOTE — Assessment & Plan Note (Signed)
Flu shot given. Pneumonia declines. Shingrix complete. Tetanus given. Colonoscopy referral to GI. Counseled about sun safety and mole surveillance. Counseled about the dangers of distracted driving. Given 10 year screening recommendations.

## 2019-09-06 NOTE — Patient Instructions (Signed)
Make sure to get an eye exam when you can.   We have given you the flu and tetanus and you should get a call about doing the colonoscopy.    Health Maintenance, Male Adopting a healthy lifestyle and getting preventive care are important in promoting health and wellness. Ask your health care provider about:  The right schedule for you to have regular tests and exams.  Things you can do on your own to prevent diseases and keep yourself healthy. What should I know about diet, weight, and exercise? Eat a healthy diet   Eat a diet that includes plenty of vegetables, fruits, low-fat dairy products, and lean protein.  Do not eat a lot of foods that are high in solid fats, added sugars, or sodium. Maintain a healthy weight Body mass index (BMI) is a measurement that can be used to identify possible weight problems. It estimates body fat based on height and weight. Your health care provider can help determine your BMI and help you achieve or maintain a healthy weight. Get regular exercise Get regular exercise. This is one of the most important things you can do for your health. Most adults should:  Exercise for at least 150 minutes each week. The exercise should increase your heart rate and make you sweat (moderate-intensity exercise).  Do strengthening exercises at least twice a week. This is in addition to the moderate-intensity exercise.  Spend less time sitting. Even light physical activity can be beneficial. Watch cholesterol and blood lipids Have your blood tested for lipids and cholesterol at 59 years of age, then have this test every 5 years. You may need to have your cholesterol levels checked more often if:  Your lipid or cholesterol levels are high.  You are older than 59 years of age.  You are at high risk for heart disease. What should I know about cancer screening? Many types of cancers can be detected early and may often be prevented. Depending on your health history and  family history, you may need to have cancer screening at various ages. This may include screening for:  Colorectal cancer.  Prostate cancer.  Skin cancer.  Lung cancer. What should I know about heart disease, diabetes, and high blood pressure? Blood pressure and heart disease  High blood pressure causes heart disease and increases the risk of stroke. This is more likely to develop in people who have high blood pressure readings, are of African descent, or are overweight.  Talk with your health care provider about your target blood pressure readings.  Have your blood pressure checked: ? Every 3-5 years if you are 33-17 years of age. ? Every year if you are 14 years old or older.  If you are between the ages of 12 and 68 and are a current or former smoker, ask your health care provider if you should have a one-time screening for abdominal aortic aneurysm (AAA). Diabetes Have regular diabetes screenings. This checks your fasting blood sugar level. Have the screening done:  Once every three years after age 29 if you are at a normal weight and have a low risk for diabetes.  More often and at a younger age if you are overweight or have a high risk for diabetes. What should I know about preventing infection? Hepatitis B If you have a higher risk for hepatitis B, you should be screened for this virus. Talk with your health care provider to find out if you are at risk for hepatitis B infection. Hepatitis  C Blood testing is recommended for:  Everyone born from [birth year] through 1966.  Anyone with known risk factors for hepatitis C. Sexually transmitted infections (STIs)  You should be screened each year for STIs, including gonorrhea and chlamydia, if: ? You are sexually active and are younger than 59 years of age. ? You are older than 59 years of age and your health care provider tells you that you are at risk for this type of infection. ? Your sexual activity has changed since you were  last screened, and you are at increased risk for chlamydia or gonorrhea. Ask your health care provider if you are at risk.  Ask your health care provider about whether you are at high risk for HIV. Your health care provider may recommend a prescription medicine to help prevent HIV infection. If you choose to take medicine to prevent HIV, you should first get tested for HIV. You should then be tested every 3 months for as long as you are taking the medicine. Follow these instructions at home: Lifestyle  Do not use any products that contain nicotine or tobacco, such as cigarettes, e-cigarettes, and chewing tobacco. If you need help quitting, ask your health care provider.  Do not use street drugs.  Do not share needles.  Ask your health care provider for help if you need support or information about quitting drugs. Alcohol use  Do not drink alcohol if your health care provider tells you not to drink.  If you drink alcohol: ? Limit how much you have to 0-2 drinks a day. ? Be aware of how much alcohol is in your drink. In the [place] one drink equals one 12 oz bottle of beer (355 mL), one 5 oz glass of wine (148 mL), or one 1 oz glass of hard liquor (44 mL). General instructions  Schedule regular health, dental, and eye exams.  Stay current with your vaccines.  Tell your health care provider if: ? You often feel depressed. ? You have ever been abused or do not feel safe at home. Summary  Adopting a healthy lifestyle and getting preventive care are important in promoting health and wellness.  Follow your health care provider's instructions about healthy diet, exercising, and getting tested or screened for diseases.  Follow your health care provider's instructions on monitoring your cholesterol and blood pressure. This information is not intended to replace advice given to you by your health care provider. Make sure you discuss any questions you have with your health care provider.  Document Released: 05/15/2008 Document Revised: 11/10/2018 Document Reviewed: 11/10/2018 Elsevier Patient Education  2020 [employer].

## 2019-10-01 ENCOUNTER — Other Ambulatory Visit: Payer: Self-pay | Admitting: Internal Medicine

## 2019-10-04 ENCOUNTER — Encounter: Payer: Self-pay | Admitting: Internal Medicine

## 2019-11-03 ENCOUNTER — Ambulatory Visit (AMBULATORY_SURGERY_CENTER): Payer: Self-pay

## 2019-11-03 ENCOUNTER — Other Ambulatory Visit: Payer: Self-pay

## 2019-11-03 VITALS — Temp 96.8°F | Ht 72.0 in | Wt 200.0 lb

## 2019-11-03 DIAGNOSIS — Z86010 Personal history of colonic polyps: Secondary | ICD-10-CM

## 2019-11-03 MED ORDER — NA SULFATE-K SULFATE-MG SULF 17.5-3.13-1.6 GM/177ML PO SOLN: 1.0000 | Freq: Once | ORAL | 0 refills | Status: AC

## 2019-11-03 NOTE — Progress Notes (Signed)
Denies allergies to eggs or soy products. Denies complication of anesthesia or sedation. Denies use of weight loss medication. Denies use of O2.   Emmi instructions given for colonoscopy.  Covid screening is scheduled for Monday 11/14/19 @ 3:20 Pm. A 15.00 coupon for Suprep was given to the patient.

## 2019-11-14 ENCOUNTER — Other Ambulatory Visit: Payer: Self-pay | Admitting: Internal Medicine

## 2019-11-14 ENCOUNTER — Ambulatory Visit (INDEPENDENT_AMBULATORY_CARE_PROVIDER_SITE_OTHER): Payer: BC Managed Care – PPO

## 2019-11-14 DIAGNOSIS — Z1159 Encounter for screening for other viral diseases: Secondary | ICD-10-CM

## 2019-11-15 LAB — SARS CORONAVIRUS 2 (TAT 6-24 HRS): SARS Coronavirus 2: NEGATIVE

## 2019-11-17 ENCOUNTER — Ambulatory Visit (AMBULATORY_SURGERY_CENTER): Payer: BC Managed Care – PPO | Admitting: Internal Medicine

## 2019-11-17 ENCOUNTER — Encounter: Payer: Self-pay | Admitting: Internal Medicine

## 2019-11-17 ENCOUNTER — Other Ambulatory Visit: Payer: Self-pay

## 2019-11-17 VITALS — BP 110/75 | HR 83 | Temp 98.3°F | Resp 15 | Ht 72.0 in | Wt 200.0 lb

## 2019-11-17 DIAGNOSIS — Z86010 Personal history of colonic polyps: Secondary | ICD-10-CM | POA: Diagnosis present

## 2019-11-17 DIAGNOSIS — D122 Benign neoplasm of ascending colon: Secondary | ICD-10-CM

## 2019-11-17 DIAGNOSIS — D124 Benign neoplasm of descending colon: Secondary | ICD-10-CM | POA: Diagnosis not present

## 2019-11-17 MED ORDER — SODIUM CHLORIDE 0.9 % IV SOLN: 500.0000 mL | Freq: Once | INTRAVENOUS | Status: DC

## 2019-11-17 NOTE — Progress Notes (Signed)
Report given to PACU, vss 

## 2019-11-17 NOTE — Patient Instructions (Signed)
YOU HAD AN ENDOSCOPIC PROCEDURE TODAY AT THE [facility]:   Refer to the procedure report that was given to you for any specific questions about what was found during the examination.  If the procedure report does not answer your questions, please call your gastroenterologist to clarify.  If you requested that your care partner not be given the details of your procedure findings, then the procedure report has been included in a sealed envelope for you to review at your convenience later.  YOU SHOULD EXPECT: Some feelings of bloating in the abdomen. Passage of more gas than usual.  Walking can help get rid of the air that was put into your GI tract during the procedure and reduce the bloating. If you had a lower endoscopy (such as a colonoscopy or flexible sigmoidoscopy) you may notice spotting of blood in your stool or on the toilet paper. If you underwent a bowel prep for your procedure, you may not have a normal bowel movement for a few days.  Please Note:  You might notice some irritation and congestion in your nose or some drainage.  This is from the oxygen used during your procedure.  There is no need for concern and it should clear up in a day or so.  SYMPTOMS TO REPORT IMMEDIATELY:   Following lower endoscopy (colonoscopy or flexible sigmoidoscopy):  Excessive amounts of blood in the stool  Significant tenderness or worsening of abdominal pains  Swelling of the abdomen that is new, acute  Fever of 100F or higher  For urgent or emergent issues, a gastroenterologist can be reached at any hour by calling [phone].   DIET:  We do recommend a small meal at first, but then you may proceed to your regular diet.  Drink plenty of fluids but you should avoid alcoholic beverages for 24 hours.  ACTIVITY:  You should plan to take it easy for the rest of today and you should NOT DRIVE or use heavy machinery until tomorrow (because of the sedation medicines used during the test).     FOLLOW UP: Our staff will call the number listed on your records 48-72 hours following your procedure to check on you and address any questions or concerns that you may have regarding the information given to you following your procedure. If we do not reach you, we will leave a message.  We will attempt to reach you two times.  During this call, we will ask if you have developed any symptoms of COVID 19. If you develop any symptoms (ie: fever, flu-like symptoms, shortness of breath, cough etc.) before then, please call [phone].  If you test positive for Covid 19 in the 2 weeks post procedure, please call and report this information to us.    If any biopsies were taken you will be contacted by phone or by letter within the next 1-3 weeks.  Please call us at [phone] if you have not heard about the biopsies in 3 weeks.    SIGNATURES/CONFIDENTIALITY: You and/or your care partner have signed paperwork which will be entered into your electronic medical record.  These signatures attest to the fact that that the information above on your After Visit Summary has been reviewed and is understood.  Full responsibility of the confidentiality of this discharge information lies with you and/or your care-partner. 

## 2019-11-17 NOTE — Progress Notes (Signed)
Called to room to assist during endoscopic procedure.  Patient ID and intended procedure confirmed with present staff. Received instructions for my participation in the procedure from the performing physician.  

## 2019-11-17 NOTE — Op Note (Signed)
[facility] Patient Name: Darrell Brown Procedure Date: 11/17/2019 8:22 AM MRN: [id number] Endoscopist: Jerene Bears , MD Age: 58 Referring MD:  Date of Birth: July 23, 1961 Gender: Male Account #: [id number] Procedure:                Colonoscopy Indications:              High risk colon cancer surveillance: Personal                            history of non-advanced adenoma, Last colonoscopy:                            February 2014 Medicines:                Monitored Anesthesia Care Procedure:                Pre-Anesthesia Assessment:                           - Prior to the procedure, a History and Physical                            was performed, and patient medications and                            allergies were reviewed. The patient's tolerance of                            previous anesthesia was also reviewed. The risks                            and benefits of the procedure and the sedation                            options and risks were discussed with the patient.                            All questions were answered, and informed consent                            was obtained. Prior Anticoagulants: The patient has                            taken no previous anticoagulant or antiplatelet                            agents. ASA Grade Assessment: II - A patient with                            mild systemic disease. After reviewing the risks                            and benefits, the patient was deemed in  satisfactory condition to undergo the procedure.                           After obtaining informed consent, the colonoscope                            was passed under direct vision. Throughout the                            procedure, the patient's blood pressure, pulse, and                            oxygen saturations were monitored continuously. The                            Colonoscope was introduced through the anus and                        advanced to the cecum, identified by appendiceal                            orifice and ileocecal valve. The colonoscopy was                            performed without difficulty. The patient tolerated                            the procedure well. The quality of the bowel                            preparation was good. The ileocecal valve,                            appendiceal orifice, and rectum were photographed. Scope In: 8:26:49 AM Scope Out: 8:46:22 AM Scope Withdrawal Time: 0 hours 14 minutes 52 seconds  Total Procedure Duration: 0 hours 19 minutes 33 seconds  Findings:                 The digital rectal exam was normal.                           Two sessile polyps were found in the ascending                            colon. The polyps were 5 to 8 mm in size. These                            polyps were removed with a cold snare. Resection                            and retrieval were complete.                           Two sessile polyps were found in the descending  colon. The polyps were 5 to 8 mm in size. These                            polyps were removed with a cold snare. Resection                            and retrieval were complete.                           Multiple small and large-mouthed diverticula were                            found in the sigmoid colon.                           Internal hemorrhoids were found during                            retroflexion. The hemorrhoids were small. Complications:            No immediate complications. Estimated Blood Loss:     Estimated blood loss was minimal. Impression:               - Two 5 to 8 mm polyps in the ascending colon,                            removed with a cold snare. Resected and retrieved.                           - Two 5 to 8 mm polyps in the descending colon,                            removed with a cold snare. Resected and retrieved.                            - Diverticulosis in the sigmoid colon.                           - Internal hemorrhoids. Recommendation:           - Patient has a contact number available for                            emergencies. The signs and symptoms of potential                            delayed complications were discussed with the                            patient. Return to normal activities tomorrow.                            Written discharge instructions were provided to the  patient.                           - Resume previous diet.                           - Continue present medications.                           - Await pathology results.                           - Repeat colonoscopy is recommended for                            surveillance. The colonoscopy date will be                            determined after pathology results from today's                            exam become available for review. Jerene Bears, MD 11/17/2019 8:48:50 AM This report has been signed electronically.

## 2019-11-17 NOTE — Progress Notes (Signed)
Pt's states no medical or surgical changes since previsit or office visit. 

## 2019-11-21 ENCOUNTER — Telehealth: Payer: Self-pay

## 2019-11-21 NOTE — Telephone Encounter (Signed)
Called [phone] and left a message we tried to reach pt for a follow up call. maw

## 2019-11-21 NOTE — Telephone Encounter (Signed)
  Follow up Call-  Call back number 11/17/2019  Post procedure Call Back phone  # [phone]  Permission to leave phone message Yes  Some recent data might be hidden     Patient questions:  Do you have a fever, pain , or abdominal swelling? No. Pain Score  0 *  Have you tolerated food without any problems? Yes.    Have you been able to return to your normal activities? Yes.    Do you have any questions about your discharge instructions: Diet   No. Medications  No. Follow up visit  No.  Do you have questions or concerns about your Care? No.  Actions: * If pain score is 4 or above: No action needed, pain <4.  1. Have you developed a fever since your procedure? no  2.   Have you had an respiratory symptoms (SOB or cough) since your procedure? no  3.   Have you tested positive for COVID 19 since your procedure no  4.   Have you had any family members/close contacts diagnosed with the COVID 19 since your procedure?  no   If yes to any of these questions please route to Joylene John, RN and Alphonsa Gin, Therapist, sports.

## 2019-11-23 ENCOUNTER — Encounter: Payer: Self-pay | Admitting: Internal Medicine

## 2020-01-18 ENCOUNTER — Telehealth: Payer: Self-pay | Admitting: Internal Medicine

## 2020-01-18 NOTE — Telephone Encounter (Signed)
Please schedule a virtual visit. Thanks  

## 2020-01-18 NOTE — Telephone Encounter (Signed)
Patient is calling and states he had a covid test and results were negative. Patient states he is still having some sinus pressure and a sore throat. Please advise.

## 2020-01-23 NOTE — Telephone Encounter (Signed)
Spoke with patient advised of message below. Patient states he is doing better and will call back if needed.

## 2020-01-27 ENCOUNTER — Telehealth: Payer: Self-pay

## 2020-01-27 NOTE — Telephone Encounter (Signed)
New message    The patient getting COVID vaccine appt today  @ 3:00 pm, please advise on health history.

## 2020-01-27 NOTE — Telephone Encounter (Signed)
Attempted to call pt, phone disconnected.  Ok to proceed with vaccination.

## 2020-06-08 ENCOUNTER — Ambulatory Visit (INDEPENDENT_AMBULATORY_CARE_PROVIDER_SITE_OTHER): Payer: BC Managed Care – PPO | Admitting: Internal Medicine

## 2020-06-08 ENCOUNTER — Encounter: Payer: Self-pay | Admitting: Internal Medicine

## 2020-06-08 ENCOUNTER — Other Ambulatory Visit: Payer: Self-pay

## 2020-06-08 DIAGNOSIS — M79675 Pain in left toe(s): Secondary | ICD-10-CM

## 2020-06-08 DIAGNOSIS — E1159 Type 2 diabetes mellitus with other circulatory complications: Secondary | ICD-10-CM | POA: Diagnosis not present

## 2020-06-08 NOTE — Assessment & Plan Note (Signed)
No signs of active infection today. There is some evidence that the 3rd toenail may be trying to ingrow as it regrows and he is advised to keep a close watch on this area since he is diabetic. Keep trimming as able and if ingrowing let us know and we will send to podiatry.

## 2020-06-08 NOTE — Patient Instructions (Signed)
Keep an eye on the toenail and if it gets infected or growing in the foot let us know.

## 2020-06-08 NOTE — Progress Notes (Signed)
° °  Subjective:   Patient ID: Darrell Brown, male    DOB: 1961/03/14, 59 y.o.   MRN: [id number]  HPI The patient is a 59 YO man coming in for left toe pain. Started several months ago with injury to the toenail and it has been growing back ever since. In the last few days there has been some pain in the toenail area and redness with swelling. Since he is a diabetic he was concerned and wanted to get this evaluated. Redness is improving today. He does try to keep the nail trimmed well.   Review of Systems  Constitutional: Negative.   HENT: Negative.   Eyes: Negative.   Respiratory: Negative for cough, chest tightness and shortness of breath.   Cardiovascular: Negative for chest pain, palpitations and leg swelling.  Gastrointestinal: Negative for abdominal distention, abdominal pain, constipation, diarrhea, nausea and vomiting.  Musculoskeletal: Negative.   Skin: Positive for color change.  Neurological: Negative.   Psychiatric/Behavioral: Negative.     Objective:  Physical Exam Constitutional:      Appearance: He is well-developed.  HENT:     Head: Normocephalic and atraumatic.  Cardiovascular:     Rate and Rhythm: Normal rate and regular rhythm.  Pulmonary:     Effort: Pulmonary effort is normal. No respiratory distress.     Breath sounds: Normal breath sounds. No wheezing or rales.  Abdominal:     General: Bowel sounds are normal. There is no distension.     Palpations: Abdomen is soft.     Tenderness: There is no abdominal tenderness. There is no rebound.  Musculoskeletal:     Cervical back: Normal range of motion.     Comments: Left 3rd toe with thick toenail and some redness with minimal swelling laterally with toenail trying to ingrow in that region  Skin:    General: Skin is warm and dry.  Neurological:     Mental Status: He is alert and oriented to person, place, and time.     Coordination: Coordination normal.     Vitals:   06/08/20 1326  BP: (!) 142/92  Pulse:  92  Temp: 98.3 F (36.8 C)  TempSrc: Oral  SpO2: 94%  Weight: 203 lb (92.1 kg)  Height: 6' (1.829 m)    This visit occurred during the SARS-CoV-2 public health emergency.  Safety protocols were in place, including screening questions prior to the visit, additional usage of staff PPE, and extensive cleaning of exam room while observing appropriate contact time as indicated for disinfecting solutions.   Assessment & Plan:

## 2020-06-08 NOTE — Assessment & Plan Note (Signed)
Under good control and no neuropathy. Reminded him that this can cause injuries to take longer to heal and he should continue to monitor his feet closely.

## 2020-08-20 LAB — HM DIABETES EYE EXAM

## 2021-04-25 ENCOUNTER — Other Ambulatory Visit: Payer: Self-pay

## 2021-04-25 ENCOUNTER — Ambulatory Visit (INDEPENDENT_AMBULATORY_CARE_PROVIDER_SITE_OTHER): Payer: BC Managed Care – PPO | Admitting: Internal Medicine

## 2021-04-25 ENCOUNTER — Encounter: Payer: Self-pay | Admitting: Internal Medicine

## 2021-04-25 VITALS — BP 142/98 | HR 79 | Temp 98.3°F | Resp 18 | Ht 72.0 in | Wt 201.2 lb

## 2021-04-25 DIAGNOSIS — E1169 Type 2 diabetes mellitus with other specified complication: Secondary | ICD-10-CM

## 2021-04-25 DIAGNOSIS — Z8673 Personal history of transient ischemic attack (TIA), and cerebral infarction without residual deficits: Secondary | ICD-10-CM | POA: Diagnosis not present

## 2021-04-25 DIAGNOSIS — Z23 Encounter for immunization: Secondary | ICD-10-CM | POA: Diagnosis not present

## 2021-04-25 DIAGNOSIS — Z720 Tobacco use: Secondary | ICD-10-CM

## 2021-04-25 DIAGNOSIS — I10 Essential (primary) hypertension: Secondary | ICD-10-CM

## 2021-04-25 DIAGNOSIS — E785 Hyperlipidemia, unspecified: Secondary | ICD-10-CM

## 2021-04-25 DIAGNOSIS — Z0000 Encounter for general adult medical examination without abnormal findings: Secondary | ICD-10-CM | POA: Diagnosis not present

## 2021-04-25 DIAGNOSIS — E1159 Type 2 diabetes mellitus with other circulatory complications: Secondary | ICD-10-CM | POA: Diagnosis not present

## 2021-04-25 DIAGNOSIS — M25522 Pain in left elbow: Secondary | ICD-10-CM | POA: Diagnosis not present

## 2021-04-25 LAB — LIPID PANEL
Cholesterol: 139 mg/dL (ref 0–200)
HDL: 35.6 mg/dL — ABNORMAL LOW (ref >39.00)
LDL Cholesterol: 86 mg/dL (ref 0–99)
NonHDL: 103.67
Total CHOL/HDL Ratio: 4
Triglycerides: 86 mg/dL (ref 0.0–149.0)
VLDL: 17.2 mg/dL (ref 0.0–40.0)

## 2021-04-25 LAB — CBC
HCT: 47.4 % (ref 39.0–52.0)
Hemoglobin: 15.9 g/dL (ref 13.0–17.0)
MCHC: 33.5 g/dL (ref 30.0–36.0)
MCV: 90.6 fl (ref 78.0–100.0)
Platelets: 276 10*3/uL (ref 150.0–400.0)
RBC: 5.23 Mil/uL (ref 4.22–5.81)
RDW: 14.3 % (ref 11.5–15.5)
WBC: 6.3 10*3/uL (ref 4.0–10.5)

## 2021-04-25 LAB — COMPREHENSIVE METABOLIC PANEL
ALT: 25 U/L (ref 0–53)
AST: 22 U/L (ref 0–37)
Albumin: 4.3 g/dL (ref 3.5–5.2)
Alkaline Phosphatase: 60 U/L (ref 39–117)
BUN: 10 mg/dL (ref 6–23)
CO2: 29 mEq/L (ref 19–32)
Calcium: 9.5 mg/dL (ref 8.4–10.5)
Chloride: 102 mEq/L (ref 96–112)
Creatinine, Ser: 0.98 mg/dL (ref 0.40–1.50)
GFR: 83.74 mL/min (ref >60.00)
Glucose, Bld: 120 mg/dL — ABNORMAL HIGH (ref 70–99)
Potassium: 4 mEq/L (ref 3.5–5.1)
Sodium: 138 mEq/L (ref 135–145)
Total Bilirubin: 0.5 mg/dL (ref 0.2–1.2)
Total Protein: 7.1 g/dL (ref 6.0–8.3)

## 2021-04-25 LAB — HEMOGLOBIN A1C: Hgb A1c MFr Bld: 7.7 % — ABNORMAL HIGH (ref 4.6–6.5)

## 2021-04-25 MED ORDER — PRAVASTATIN SODIUM 20 MG PO TABS: 20.0000 mg | ORAL_TABLET | Freq: Every day | ORAL | 3 refills | Status: DC

## 2021-04-25 NOTE — Progress Notes (Signed)
   Subjective:   Patient ID: Darrell Brown, male    DOB: 1961/10/16, 60 y.o.   MRN: [id number]  HPI The patient is a 60 YO man coming in for physical.   PMH, [place], social history reviewed and updated  Review of Systems  Constitutional: Negative.   HENT: Negative.   Eyes: Negative.   Respiratory: Negative for cough, chest tightness and shortness of breath.   Cardiovascular: Negative for chest pain, palpitations and leg swelling.  Gastrointestinal: Negative for abdominal distention, abdominal pain, constipation, diarrhea, nausea and vomiting.  Musculoskeletal: Positive for arthralgias and myalgias.  Skin: Negative.   Neurological: Negative.   Psychiatric/Behavioral: Negative.     Objective:  Physical Exam Constitutional:      Appearance: He is well-developed.  HENT:     Head: Normocephalic and atraumatic.  Cardiovascular:     Rate and Rhythm: Normal rate and regular rhythm.  Pulmonary:     Effort: Pulmonary effort is normal. No respiratory distress.     Breath sounds: Normal breath sounds. No wheezing or rales.  Abdominal:     General: Bowel sounds are normal. There is no distension.     Palpations: Abdomen is soft.     Tenderness: There is no abdominal tenderness. There is no rebound.  Musculoskeletal:        General: Tenderness present.     Cervical back: Normal range of motion.  Skin:    General: Skin is warm and dry.  Neurological:     Mental Status: He is alert and oriented to person, place, and time.     Coordination: Coordination normal.     Vitals:   04/25/21 0902  BP: (!) 142/98  Pulse: 79  Resp: 18  Temp: 98.3 F (36.8 C)  TempSrc: Oral  SpO2: 96%  Weight: 201 lb 3.2 oz (91.3 kg)  Height: 6' (1.829 m)    This visit occurred during the SARS-CoV-2 public health emergency.  Safety protocols were in place, including screening questions prior to the visit, additional usage of staff PPE, and extensive cleaning of exam room while observing appropriate  contact time as indicated for disinfecting solutions.   Assessment & Plan:  Shingrix IM given at visit

## 2021-04-25 NOTE — Patient Instructions (Addendum)
Call [phone]-[email or web address] NOW try the nicotine patches.  We have given you the shingles vaccine today.   Make sure to start taking the aspirin and the pravastatin.   Health Maintenance, Male Adopting a healthy lifestyle and getting preventive care are important in promoting health and wellness. Ask your health care provider about:  The right schedule for you to have regular tests and exams.  Things you can do on your own to prevent diseases and keep yourself healthy. What should I know about diet, weight, and exercise? Eat a healthy diet  Eat a diet that includes plenty of vegetables, fruits, low-fat dairy products, and lean protein.  Do not eat a lot of foods that are high in solid fats, added sugars, or sodium.   Maintain a healthy weight Body mass index (BMI) is a measurement that can be used to identify possible weight problems. It estimates body fat based on height and weight. Your health care provider can help determine your BMI and help you achieve or maintain a healthy weight. Get regular exercise Get regular exercise. This is one of the most important things you can do for your health. Most adults should:  Exercise for at least 150 minutes each week. The exercise should increase your heart rate and make you sweat (moderate-intensity exercise).  Do strengthening exercises at least twice a week. This is in addition to the moderate-intensity exercise.  Spend less time sitting. Even light physical activity can be beneficial. Watch cholesterol and blood lipids Have your blood tested for lipids and cholesterol at 61 years of age, then have this test every 5 years. You may need to have your cholesterol levels checked more often if:  Your lipid or cholesterol levels are high.  You are older than 61 years of age.  You are at high risk for heart disease. What should I know about cancer screening? Many types of cancers can be detected early and may often be prevented. Depending on your  health history and family history, you may need to have cancer screening at various ages. This may include screening for:  Colorectal cancer.  Prostate cancer.  Skin cancer.  Lung cancer. What should I know about heart disease, diabetes, and high blood pressure? Blood pressure and heart disease  High blood pressure causes heart disease and increases the risk of stroke. This is more likely to develop in people who have high blood pressure readings, are of African descent, or are overweight.  Talk with your health care provider about your target blood pressure readings.  Have your blood pressure checked: ? Every 3-5 years if you are 40-23 years of age. ? Every year if you are 36 years old or older.  If you are between the ages of 12 and 39 and are a current or former smoker, ask your health care provider if you should have a one-time screening for abdominal aortic aneurysm (AAA). Diabetes Have regular diabetes screenings. This checks your fasting blood sugar level. Have the screening done:  Once every three years after age 71 if you are at a normal weight and have a low risk for diabetes.  More often and at a younger age if you are overweight or have a high risk for diabetes. What should I know about preventing infection? Hepatitis B If you have a higher risk for hepatitis B, you should be screened for this virus. Talk with your health care provider to find out if you are at risk for hepatitis B infection. Hepatitis C Blood  testing is recommended for:  Everyone born from [birth year] through 1966.  Anyone with known risk factors for hepatitis C. Sexually transmitted infections (STIs)  You should be screened each year for STIs, including gonorrhea and chlamydia, if: ? You are sexually active and are younger than 61 years of age. ? You are older than 61 years of age and your health care provider tells you that you are at risk for this type of infection. ? Your sexual activity has changed  since you were last screened, and you are at increased risk for chlamydia or gonorrhea. Ask your health care provider if you are at risk.  Ask your health care provider about whether you are at high risk for HIV. Your health care provider may recommend a prescription medicine to help prevent HIV infection. If you choose to take medicine to prevent HIV, you should first get tested for HIV. You should then be tested every 3 months for as long as you are taking the medicine. Follow these instructions at home: Lifestyle  Do not use any products that contain nicotine or tobacco, such as cigarettes, e-cigarettes, and chewing tobacco. If you need help quitting, ask your health care provider.  Do not use street drugs.  Do not share needles.  Ask your health care provider for help if you need support or information about quitting drugs. Alcohol use  Do not drink alcohol if your health care provider tells you not to drink.  If you drink alcohol: ? Limit how much you have to 0-2 drinks a day. ? Be aware of how much alcohol is in your drink. In the [place] one drink equals one 12 oz bottle of beer (355 mL), one 5 oz glass of wine (148 mL), or one 1 oz glass of hard liquor (44 mL). General instructions  Schedule regular health, dental, and eye exams.  Stay current with your vaccines.  Tell your health care provider if: ? You often feel depressed. ? You have ever been abused or do not feel safe at home. Summary  Adopting a healthy lifestyle and getting preventive care are important in promoting health and wellness.  Follow your health care provider's instructions about healthy diet, exercising, and getting tested or screened for diseases.  Follow your health care provider's instructions on monitoring your cholesterol and blood pressure. This information is not intended to replace advice given to you by your health care provider. Make sure you discuss any questions you have with your health care  provider. Document Revised: 11/10/2018 Document Reviewed: 11/10/2018 Elsevier Patient Education  2021 [employer].

## 2021-04-26 ENCOUNTER — Telehealth: Payer: Self-pay | Admitting: Internal Medicine

## 2021-04-26 ENCOUNTER — Encounter: Payer: Self-pay | Admitting: Internal Medicine

## 2021-04-26 DIAGNOSIS — M25522 Pain in left elbow: Secondary | ICD-10-CM | POA: Insufficient documentation

## 2021-04-26 NOTE — Assessment & Plan Note (Signed)
Checking lipid panel and restart pravastatin 20 mg daily which is refilled today. Reminded him about the importance of this.

## 2021-04-26 NOTE — Assessment & Plan Note (Signed)
Flu shot yearly. Covid-19 3 shots counseled about 4th. Pneumonia declines today. Shingrix given. Tetanus up to date. Colonoscopy up to date. Counseled about sun safety and mole surveillance. Counseled about the dangers of distracted driving. Given 10 year screening recommendations.

## 2021-04-26 NOTE — Telephone Encounter (Signed)
Patient called and was wondering if she should have got his shingles shot yesterday sine having them done in 2019. He said that his left arm was sore. He is requesting a call back, he can be reached at [phone]. Please advise

## 2021-04-26 NOTE — Assessment & Plan Note (Signed)
Needs tight BP control and will likely start BP medication as this is elevated last 2 visits.

## 2021-04-26 NOTE — Assessment & Plan Note (Signed)
Refer to sports medicine for assessment and treatment.

## 2021-04-26 NOTE — Telephone Encounter (Signed)
Unable to get in contact with the patient. LDVM letting him know that per Dr. Sharlet Salina he did have shingrix done in 2019 and him receiving a 3rd dose is not harmful. He does not need to come back for additional dose. Nurse visit has been cancelled. Office number was provided in case he has any additional questions or concerns.

## 2021-04-26 NOTE — Assessment & Plan Note (Signed)
Needs tighter BP control and checking HgA1c. Adjust as needed likely needs BP agent.

## 2021-04-26 NOTE — Assessment & Plan Note (Signed)
Foot exam done, checking HgA1c, lipid panel. Adjust as needed. He has stopped taking his pravastatin lately and reminded of importance and refilled today.

## 2021-04-26 NOTE — Assessment & Plan Note (Signed)
Would like to quit and talked to him about bupropion and nicotine products to help. He will try nicotine products.

## 2021-05-01 NOTE — Progress Notes (Signed)
Subjective:    I'm seeing this patient as a consultation for:  Dr. Sharlet Salina. Note will be routed back to referring provider/PCP.  CC: L elbow pain  I, Molly Weber, LAT, ATC, am serving as scribe for Dr. Lynne Leader.  HPI: Pt is a 60 y/o male presenting w/ c/o L elbow pain x 3-4 weeks w/ no known MOI .  He locates his pain to his L post-lat elbow.  Radiating pain: yes L elbow swelling: No Aggravating factors: lifting; gripping Treatments tried: Advil; ice  Past medical history, Surgical history, Family history, Social history, Allergies, and medications have been entered into the medical record, reviewed.   Review of Systems: No new headache, visual changes, nausea, vomiting, diarrhea, constipation, dizziness, abdominal pain, skin rash, fevers, chills, night sweats, weight loss, swollen lymph nodes, body aches, joint swelling, muscle aches, chest pain, shortness of breath, mood changes, visual or auditory hallucinations.   Objective:    Vitals:   05/02/21 0809  BP: 132/86  Pulse: 84  SpO2: 96%   General: Well Developed, well nourished, and in no acute distress.  Neuro/Psych: Alert and oriented x3, extra-ocular muscles intact, able to move all 4 extremities, sensation grossly intact. Skin: Warm and dry, small erythematous papule left neck.  Mildly tender to palpation.  No surrounding erythema or induration. Respiratory: Not using accessory muscles, speaking in full sentences, trachea midline.  Cardiovascular: Pulses palpable, no extremity edema. Abdomen: Does not appear distended. MSK: Left elbow: Normal.  Normal motion.  Tender palpation lateral epicondyle.  Intact strength elbow flexion and extension. Pain with resisted wrist extension. Pulses capillary refill and sensation are intact distally.  Lab and Radiology Results  Diagnostic Limited MSK Ultrasound of: Left lateral epicondyle Common extensor tendon insertion site reveals small avulsion fleck superficial portion of  lateral epicondyle. Hyperechoic change at extensor tendon insertion site indicates chronic calcific tendinopathy Impression: Lateral epicondylitis with chronic calcific change   Impression and Recommendations:    Assessment and Plan: 60 y.o. male with left elbow pain due to lateral epicondylitis.  Patient also has evidence of chronic calcific tendinopathy.  Plan for home exercise program taught in clinic today by ATC.  Also use Voltaren gel.  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  Recheck in 1 month.  Additionally he has an erythematous papule on his left neck.  He attributes this to a spider bite but does not recall an actual spider bite.  I think it is a mosquito bite but it may be a developing folliculitis.  Plan for a bit of watchful waiting and if not improving or if worsening I have prescribed doxycycline.  Advised him to take it in a day or 2 if worsening or not improving.  Caution against potential side effects of doxycycline including increased risk of sunburn.  PDMP not reviewed this encounter. Orders Placed This Encounter  Procedures  . [place] LIMITED JOINT SPACE STRUCTURES UP LEFT(NO LINKED CHARGES)    Order Specific Question:   Reason for Exam (SYMPTOM  OR DIAGNOSIS REQUIRED)    Answer:   L elbow pain    Order Specific Question:   Preferred imaging location?    Answer:   [place]   Meds ordered this encounter  Medications  . doxycycline (VIBRA-TABS) 100 MG tablet    Sig: Take 1 tablet (100 mg total) by mouth 2 (two) times daily for 7 days.    Dispense:  14  tablet    Refill:  0    Discussed warning signs or symptoms. Please see discharge instructions. Patient expresses understanding.   The above documentation has been reviewed and is accurate and complete Lynne Leader, M.D.

## 2021-05-02 ENCOUNTER — Ambulatory Visit (INDEPENDENT_AMBULATORY_CARE_PROVIDER_SITE_OTHER): Payer: BC Managed Care – PPO | Admitting: Family Medicine

## 2021-05-02 ENCOUNTER — Other Ambulatory Visit: Payer: Self-pay

## 2021-05-02 ENCOUNTER — Ambulatory Visit: Payer: Self-pay

## 2021-05-02 ENCOUNTER — Encounter: Payer: Self-pay | Admitting: Family Medicine

## 2021-05-02 VITALS — BP 132/86 | HR 84 | Ht 72.0 in | Wt 199.8 lb

## 2021-05-02 DIAGNOSIS — L739 Follicular disorder, unspecified: Secondary | ICD-10-CM

## 2021-05-02 DIAGNOSIS — M7712 Lateral epicondylitis, left elbow: Secondary | ICD-10-CM | POA: Insufficient documentation

## 2021-05-02 DIAGNOSIS — M25522 Pain in left elbow: Secondary | ICD-10-CM | POA: Diagnosis not present

## 2021-05-02 MED ORDER — DOXYCYCLINE HYCLATE 100 MG PO TABS: 100.0000 mg | ORAL_TABLET | Freq: Two times a day (BID) | ORAL | 0 refills | Status: AC

## 2021-05-02 NOTE — Patient Instructions (Addendum)
Good to see you today.  Please perform the exercise program that we have prepared for you and gone over in detail on a daily basis.  In addition to the handout you were provided you can access your program through: [email or web address]   Your unique program code is:  [id number]  Use a wrist brace as needed with heavt duty activity.  (the carpal tunnel type would be ok)  A tennis elbow strap (Counter force brace) could help.   The exercises will help a lot.   Please use Voltaren gel (Generic Diclofenac Gel) up to 4x daily for pain as needed.  This is available over-the-counter as both the name brand Voltaren gel and the generic diclofenac gel.   The bump on the neck I think is a mosquito bite.  It could be a developing skin infection.  If you do not get better start the doxycycline.   The antibiotic does not absorb well with calcium so dont take it with dairy or tums It also makes it easier to get a sunburn.   Recheck with me in 1 month.

## 2021-06-04 NOTE — Progress Notes (Signed)
   I, Peterson Lombard, LAT, ATC acting as a scribe for Lynne Leader, MD.  Darrell Brown is a 60 y.o. male who presents to [place] at [facility] today for f/u L elbow pain thought to be due to lateral epicondylitis and calcific tendinopathy. Pt is a fisherman. Pt was last seen by Dr.Corderius Saraceni on 05/02/21 and was taught HEP and advised to use counterforce brace and Voltaren gel. Today, pt reports L elbow is still painful, but not quite as bad. Pt associates joint pain w/ his cholesterol medicine. Pt stopped taking his cholesterol meds on Friday and notes improvement in his elbow pain. Pt has not been very compliant w/ HEP.  He has been on multiple different statins most recently pravastatin 20 mg.  He has trouble tolerating even pravastatin at low-dose causing significant arthralgias.   Pertinent review of systems: No fevers or chills  Relevant historical information: Hyperlipidemia   Exam:  BP (!) 138/92 (BP Location: Right Arm, Patient Position: Sitting, Cuff Size: Normal)   Pulse 85   Ht 6' (1.829 m)   Wt 198 lb 9.6 oz (90.1 kg)   SpO2 95%   BMI 26.94 kg/m  General: Well Developed, well nourished, and in no acute distress.   MSK: Left elbow normal. Mildly tender to palpation lateral epicondyle. Some pain with resisted wrist extension present at lateral epicondyle.    Lab and Radiology Results  Diagnostic Limited MSK Ultrasound of: Left lateral epicondyle Common extensor tendon insertion site reveals small avulsion fleck superficial portion of lateral epicondyle. Hyperechoic change at extensor tendon insertion site indicates chronic calcific tendinopathy Impression: Lateral epicondylitis with chronic calcific change     Assessment and Plan: 60 y.o. male with left lateral elbow pain due to lateral epicondylitis.  Improving with home exercise program and counterforce brace.  Continue home exercise program and add nitroglycerin patch protocol in the future if  needed.  Patient is improving so we will proceed with watchful waiting from here along with home exercise program.  Additionally patient is having bothersome arthralgias and myalgias from statins.  He reports a history of multiple different statin attempts in the past most recently on low-dose pravastatin having trouble with that.  He stopped the pravastatin on his own about 5 days ago and is feeling a lot better.  It sounds like he may have trouble tolerating conventional statins and may be a good candidate for Pitavastatin or even the injectable medications like Repatha or Praluent. I discussed situation with Abhi a bit.  I do think it is important for him to manage his cholesterol. Will discuss the case with his PCP.  Recommend Zypitamag (pitavastatin) at [facility] drug as it is very affordable there.     Discussed warning signs or symptoms. Please see discharge instructions. Patient expresses understanding.   The above documentation has been reviewed and is accurate and complete Lynne Leader, M.D.

## 2021-06-06 ENCOUNTER — Ambulatory Visit (INDEPENDENT_AMBULATORY_CARE_PROVIDER_SITE_OTHER): Payer: BC Managed Care – PPO | Admitting: Family Medicine

## 2021-06-06 ENCOUNTER — Other Ambulatory Visit: Payer: Self-pay

## 2021-06-06 VITALS — BP 138/92 | HR 85 | Ht 72.0 in | Wt 198.6 lb

## 2021-06-06 DIAGNOSIS — Z789 Other specified health status: Secondary | ICD-10-CM

## 2021-06-06 DIAGNOSIS — M7712 Lateral epicondylitis, left elbow: Secondary | ICD-10-CM | POA: Diagnosis not present

## 2021-06-06 NOTE — Patient Instructions (Signed)
Thank you for coming in today.   I will talk with Dr Sharlet Salina about statins.   Keep working on the elbow exercises.   Let me know if you are not good enough with the elbow. There is more to do including cortisone shot and even a special kind of shot called PRP.   Use that brace.

## 2021-06-10 ENCOUNTER — Other Ambulatory Visit: Payer: Self-pay | Admitting: Internal Medicine

## 2021-06-10 MED ORDER — PITAVASTATIN CALCIUM 1 MG PO TABS: 1.0000 mg | ORAL_TABLET | Freq: Every day | ORAL | 3 refills | Status: DC

## 2021-06-10 NOTE — Telephone Encounter (Signed)
See below

## 2021-07-12 ENCOUNTER — Telehealth: Payer: Self-pay | Admitting: Internal Medicine

## 2021-07-12 NOTE — Telephone Encounter (Signed)
Patient received a 90 day supply with 3 refills on 06/10/2021.

## 2021-07-31 ENCOUNTER — Ambulatory Visit: Payer: BC Managed Care – PPO

## 2022-04-29 ENCOUNTER — Encounter: Payer: Self-pay | Admitting: Internal Medicine

## 2022-04-29 ENCOUNTER — Ambulatory Visit (INDEPENDENT_AMBULATORY_CARE_PROVIDER_SITE_OTHER): Payer: BC Managed Care – PPO | Admitting: Internal Medicine

## 2022-04-29 VITALS — BP 130/80 | HR 83 | Resp 18 | Ht 72.0 in | Wt 201.4 lb

## 2022-04-29 DIAGNOSIS — Z8673 Personal history of transient ischemic attack (TIA), and cerebral infarction without residual deficits: Secondary | ICD-10-CM

## 2022-04-29 DIAGNOSIS — Z0000 Encounter for general adult medical examination without abnormal findings: Secondary | ICD-10-CM

## 2022-04-29 DIAGNOSIS — E1159 Type 2 diabetes mellitus with other circulatory complications: Secondary | ICD-10-CM | POA: Diagnosis not present

## 2022-04-29 DIAGNOSIS — E785 Hyperlipidemia, unspecified: Secondary | ICD-10-CM | POA: Diagnosis not present

## 2022-04-29 DIAGNOSIS — K635 Polyp of colon: Secondary | ICD-10-CM

## 2022-04-29 DIAGNOSIS — E1169 Type 2 diabetes mellitus with other specified complication: Secondary | ICD-10-CM | POA: Diagnosis not present

## 2022-04-29 DIAGNOSIS — Z720 Tobacco use: Secondary | ICD-10-CM

## 2022-04-29 DIAGNOSIS — I10 Essential (primary) hypertension: Secondary | ICD-10-CM

## 2022-04-29 LAB — LIPID PANEL
Cholesterol: 145 mg/dL (ref 0–200)
HDL: 36.2 mg/dL — ABNORMAL LOW (ref >39.00)
LDL Cholesterol: 91 mg/dL (ref 0–99)
NonHDL: 108.69
Total CHOL/HDL Ratio: 4
Triglycerides: 88 mg/dL (ref 0.0–149.0)
VLDL: 17.6 mg/dL (ref 0.0–40.0)

## 2022-04-29 LAB — COMPREHENSIVE METABOLIC PANEL
ALT: 32 U/L (ref 0–53)
AST: 31 U/L (ref 0–37)
Albumin: 4.3 g/dL (ref 3.5–5.2)
Alkaline Phosphatase: 63 U/L (ref 39–117)
BUN: 9 mg/dL (ref 6–23)
CO2: 27 mEq/L (ref 19–32)
Calcium: 9.4 mg/dL (ref 8.4–10.5)
Chloride: 100 mEq/L (ref 96–112)
Creatinine, Ser: 1 mg/dL (ref 0.40–1.50)
GFR: 81.16 mL/min (ref >60.00)
Glucose, Bld: 184 mg/dL — ABNORMAL HIGH (ref 70–99)
Potassium: 4 mEq/L (ref 3.5–5.1)
Sodium: 135 mEq/L (ref 135–145)
Total Bilirubin: 0.4 mg/dL (ref 0.2–1.2)
Total Protein: 6.8 g/dL (ref 6.0–8.3)

## 2022-04-29 LAB — CBC
HCT: 45.5 % (ref 39.0–52.0)
Hemoglobin: 15.2 g/dL (ref 13.0–17.0)
MCHC: 33.5 g/dL (ref 30.0–36.0)
MCV: 92.7 fl (ref 78.0–100.0)
Platelets: 257 10*3/uL (ref 150.0–400.0)
RBC: 4.91 Mil/uL (ref 4.22–5.81)
RDW: 13.6 % (ref 11.5–15.5)
WBC: 6.2 10*3/uL (ref 4.0–10.5)

## 2022-04-29 LAB — HEMOGLOBIN A1C: Hgb A1c MFr Bld: 9.1 % — ABNORMAL HIGH (ref 4.6–6.5)

## 2022-04-29 LAB — MICROALBUMIN / CREATININE URINE RATIO
Creatinine,U: 63.3 mg/dL
Microalb Creat Ratio: 1.1 mg/g (ref 0.0–30.0)
Microalb, Ur: <0.7 mg/dL (ref 0.0–1.9)

## 2022-04-29 MED ORDER — ROSUVASTATIN CALCIUM 5 MG PO TABS: 5.0000 mg | ORAL_TABLET | ORAL | 3 refills | Status: DC

## 2022-04-29 NOTE — Patient Instructions (Signed)
We will check the labs. If the knee does not improver let us know.

## 2022-04-29 NOTE — Assessment & Plan Note (Signed)
Flu shot yearly. Covid-19 counseled. Shingrix complete. Tetanus up to date. Colonoscopy due this year referral done. Counseled about sun safety and mole surveillance. Counseled about the dangers of distracted driving. Given 10 year screening recommendations.

## 2022-04-29 NOTE — Assessment & Plan Note (Signed)
Foot exam done, counseled about eye exam. Checking HgA1c, microalbumin to creatinine ratio, CMP, lipid panel. Adjust as needed. Diet controlled currently. Is not on ACE-I/ARB. Stopped taking statin willing to try crestor 5 mg every other day which is prescribed.

## 2022-04-29 NOTE — Assessment & Plan Note (Signed)
No new stroke symptoms

## 2022-04-29 NOTE — Assessment & Plan Note (Signed)
Time spent counseling about tobacco usage: 6 minutes. I have asked about smoking and is smoking less than usual. The patient is advised to quit. The patient is willing to quit. They would like to try to quit in the next 6 months. We will follow up with them in 6-12 months. Counseled about coping skills to help quit.

## 2022-04-29 NOTE — Assessment & Plan Note (Signed)
BP at goal without medication. Given past stroke needs close and continued monitoring at every visit. Goal <130/80.

## 2022-04-29 NOTE — Progress Notes (Signed)
   Subjective:   Patient ID: Darrell Brown, male    DOB: 1960-12-21, 61 y.o.   MRN: [id number]  HPI The patient is here for physical.  PMH, [facility], social history reviewed and updated  Review of Systems  Constitutional: Negative.   HENT: Negative.    Eyes: Negative.   Respiratory:  Negative for cough, chest tightness and shortness of breath.   Cardiovascular:  Negative for chest pain, palpitations and leg swelling.  Gastrointestinal:  Negative for abdominal distention, abdominal pain, constipation, diarrhea, nausea and vomiting.  Musculoskeletal:  Positive for arthralgias.  Skin: Negative.   Neurological: Negative.   Psychiatric/Behavioral: Negative.     Objective:  Physical Exam Constitutional:      Appearance: He is well-developed.  HENT:     Head: Normocephalic and atraumatic.  Cardiovascular:     Rate and Rhythm: Normal rate and regular rhythm.  Pulmonary:     Effort: Pulmonary effort is normal. No respiratory distress.     Breath sounds: Normal breath sounds. No wheezing or rales.  Abdominal:     General: Bowel sounds are normal. There is no distension.     Palpations: Abdomen is soft.     Tenderness: There is no abdominal tenderness. There is no rebound.  Musculoskeletal:        General: Tenderness present.     Cervical back: Normal range of motion.  Skin:    General: Skin is warm and dry.     Comments: Foot exam done  Neurological:     Mental Status: He is alert and oriented to person, place, and time.     Coordination: Coordination normal.    Vitals:   04/29/22 0848  BP: 130/80  Pulse: 83  Resp: 18  SpO2: 96%  Weight: 201 lb 6.4 oz (91.4 kg)  Height: 6' (1.829 m)    This visit occurred during the SARS-CoV-2 public health emergency.  Safety protocols were in place, including screening questions prior to the visit, additional usage of staff PPE, and extensive cleaning of exam room while observing appropriate contact time as indicated for disinfecting  solutions.   Assessment & Plan:

## 2022-04-29 NOTE — Assessment & Plan Note (Signed)
Checking lipid panel but stopped pitavastatin due to cost. Rx crestor 5 mg every other day to avoid side effects. If tolerating we will increase to daily then gradually titrate to goal LDL <100.

## 2022-07-22 ENCOUNTER — Telehealth (INDEPENDENT_AMBULATORY_CARE_PROVIDER_SITE_OTHER): Payer: BC Managed Care – PPO | Admitting: Family Medicine

## 2022-07-22 DIAGNOSIS — U071 COVID-19: Secondary | ICD-10-CM

## 2022-07-22 MED ORDER — BENZONATATE 100 MG PO CAPS: ORAL_CAPSULE | ORAL | 0 refills | Status: DC

## 2022-07-22 MED ORDER — NIRMATRELVIR/RITONAVIR (PAXLOVID)TABLET: 3.0000 | ORAL_TABLET | Freq: Two times a day (BID) | ORAL | 0 refills | Status: AC

## 2022-07-22 NOTE — Progress Notes (Signed)
Virtual Visit via Video Note  I connected with Darrell Brown  on 07/22/22 at 12:40 PM EDT by a video enabled telemedicine application and verified that I am speaking with the correct person using two identifiers.  Location patient: [place] Location provider:work or home office Persons participating in the virtual visit: patient, provider  I discussed the limitations and requested verbal permission for telemedicine visit. The patient expressed understanding and agreed to proceed.   HPI:  Acute telemedicine visit for Covid19: -Onset: yesterday, covid test positive today -Symptoms include: body aches, scratchy throat, headache, low grade temp around 100, some nasal congestion - but reports has this at baseline -Denies: NVD, CP, SOB -able to drink fluids and get up and down -Has tried: advil, excedrin once, sudafed -Pertinent past medical history: see below, GFR 81 on labs in May -Pertinent medication allergies:No Known Allergies -COVID-19 vaccine status: has had 2 doses and 3 boosters Immunization History  Administered Date(s) Administered   Influenza Whole 12/01/2006   Influenza,inj,Quad PF,6+ Mos 09/06/2019   PFIZER(Purple Top)SARS-COV-2 Vaccination 01/27/2020, 02/18/2020, 11/01/2020   Td 12/01/2005   Tdap 09/06/2019   Zoster Recombinat (Shingrix) 04/13/2018, 08/05/2018, 04/25/2021     ROS: See pertinent positives and negatives per HPI.  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   Costochondritis    Degenerative disc disease    Diabetes mellitus without complication (HCC)    Diverticulitis    Familial tremor    History of chicken pox    History of low back pain    History of mumps    Hyperlipidemia    Neural hearing loss, bilateral    Stroke [facility])     Past Surgical History:  Procedure Laterality Date   cyst removed from neck     INGUINAL HERNIA REPAIR     Left      Current Outpatient Medications:     aspirin EC 81 MG tablet, Take 81 mg by mouth daily., Disp: , Rfl:    benzonatate (TESSALON PERLES) 100 MG capsule, 1-2 capsules up to twice daily as needed for cough, Disp: 30 capsule, Rfl: 0   ibuprofen (ADVIL,MOTRIN) 200 MG tablet, Take 200-800 mg by mouth every 6 (six) hours as needed for moderate pain., Disp: , Rfl:    nirmatrelvir/ritonavir EUA (PAXLOVID) 20 x 150 MG & 10 x '100MG'$  TABS, Take 3 tablets by mouth 2 (two) times daily for 5 days. (Take nirmatrelvir 150 mg two tablets twice daily for 5 days and ritonavir 100 mg one tablet twice daily for 5 days) Patient GFR is > 60, Disp: 30 tablet, Rfl: 0   rosuvastatin (CRESTOR) 5 MG tablet, Take 1 tablet (5 mg total) by mouth every other day., Disp: 45 tablet, Rfl: 3  EXAM:  VITALS per patient if applicable:  GENERAL: alert, oriented, appears well and in no acute distress  HEENT: atraumatic, conjunttiva clear, no obvious abnormalities on inspection of external nose and ears  NECK: normal movements of the head and neck  LUNGS: on inspection no signs of respiratory distress, breathing rate appears normal, no obvious Brown SOB, gasping or wheezing  CV: no obvious cyanosis  MS: moves all visible extremities without noticeable abnormality  PSYCH/NEURO: pleasant and cooperative  Discussed the following assessment and plan:  COVID-19   Discussed treatment options, side effect and risk of drug interactions, ideal treatment window, potential complications, isolation and precautions for COVID-19.  Discussed  possibility of rebound with or without antivirals. Checked for/reviewed last GFR - listed in HPI if available.  After lengthy discussion, the patient opted for treatment with Paxlovid due to being higher risk for complications of covid or severe disease and other factors. Discussed that there is preliminary limited knowledge of risks/interactions/side effects per EUA document vs possible benefits and precautions. This information was shared  with patient during the visit and also was provided in patient instructions. He agrees to hold statin. Also, advised that patient discuss risks/interactions and use with pharmacist/treatment team as well. The patient did want a prescription for cough, Tessalon Rx sent.  Other symptomatic care measures summarized in patient instructions. Work/School slipped offered: declined Advised to seek prompt virtual visit or in person care if worsening, new symptoms arise, or if is not improving with treatment as expected per our conversation of expected course. Discussed options for follow up care. Did let this patient know that I do telemedicine on Tuesdays and Thursdays for [facility] and those are the days I am logged into the system. Advised to schedule follow up visit with PCP, [facility] virtual visits or [facility] if any further questions or concerns to avoid delays in care.   I discussed the assessment and treatment plan with the patient. The patient was provided an opportunity to ask questions and all were answered. The patient agreed with the plan and demonstrated an understanding of the instructions.     Lucretia Kern, DO

## 2022-07-22 NOTE — Patient Instructions (Addendum)
HOME CARE TIPS:  -I sent the medication(s) we discussed to your pharmacy: Meds ordered this encounter  Medications   nirmatrelvir/ritonavir EUA (PAXLOVID) 20 x 150 MG & 10 x '100MG'$  TABS    Sig: Take 3 tablets by mouth 2 (two) times daily for 5 days. (Take nirmatrelvir 150 mg two tablets twice daily for 5 days and ritonavir 100 mg one tablet twice daily for 5 days) Patient GFR is > 60    Dispense:  30 tablet    Refill:  0   benzonatate (TESSALON PERLES) 100 MG capsule    Sig: 1-2 capsules up to twice daily as needed for cough    Dispense:  30 capsule    Refill:  0     -I sent in the [facility] treatment or referral you requested per our discussion. Please see the information provided below and discuss further with the pharmacist/treatment team.  -If taking Paxlovid, please review all medications, supplement and over the counter drugs with your pharmacist and ask them to check for any interactions. Please make the following changes to your regular medications while taking Paxlovid: *Hold Crestor (rosuvastatin) and restart 3 days after finishing Paxlovid  -there is a chance of rebound illness with covid after improving. This can happen whether or not you take an antiviral treatment. If you become sick again with covid after getting better, please schedule a follow up virtual visit and isolate again.  -can use tylenol  if needed for fevers, aches and pains per instructions  -nasal saline sinus rinses twice daily  -stay hydrated, drink plenty of fluids and eat small healthy meals - avoid dairy  -follow up with your doctor in 2-3 days unless improving and feeling better  -stay home while sick, except to seek medical care. If you have COVID19, you will likely be contagious for 7-10 days. Flu or Influenza is likely contagious for about 7 days. Other respiratory viral infections remain contagious for 5-10+ days depending on the virus and many other factors. Wear a good mask that fits snugly (such  as N95 or KN95) if around others to reduce the risk of transmission.  It was nice to meet you today, and I really hope you are feeling better soon. I help  out with telemedicine visits on Tuesdays and Thursdays and am happy to help if you need a follow up virtual visit on those days. Otherwise, if you have any concerns or questions following this visit please schedule a follow up visit with your Primary Care doctor or seek care at a local urgent care clinic to avoid delays in care.    Seek in person care or schedule a follow up video visit promptly if your symptoms worsen, new concerns arise or you are not improving with treatment. Call 911 and/or seek emergency care if your symptoms are severe or life threatening.   See the following link for the most recent information regarding Paxlovid:  [email or web address].com   Nirmatrelvir; Ritonavir Tablets What is this medication? NIRMATRELVIR; RITONAVIR (NIR ma TREL vir; ri TOE na veer) treats mild to moderate COVID-19. It may help people who are at high risk of developing severe illness. This medication works by limiting the spread of the virus in your body. The FDA has allowed the emergency use of this medication. This medicine may be used for other purposes; ask your health care provider or pharmacist if you have questions. COMMON BRAND NAME(S): PAXLOVID What should I tell my care team before I take this medication? They need  to know if you have any of these conditions: Any allergies Any serious illness Kidney disease Liver disease An unusual or allergic reaction to nirmatrelvir, ritonavir, other medications, foods, dyes, or preservatives Pregnant or trying to get pregnant Breast-feeding How should I use this medication? This product contains 2 different medications that are packaged together. For the standard dose, take 2 pink tablets of nirmatrelvir with 1 white tablet of ritonavir (3 tablets total) by mouth with water twice daily. Talk  to your care team if you have kidney disease. You may need a different dose. Swallow the tablets whole. You can take it with or without food. If it upsets your stomach, take it with food. Take all of this medication unless your care team tells you to stop it early. Keep taking it even if you think you are better. Talk to your care team about the use of this medication in children. While it may be prescribed for children as young as 12 years for selected conditions, precautions do apply. Overdosage: If you think you have taken too much of this medicine contact a poison control center or emergency room at once. NOTE: This medicine is only for you. Do not share this medicine with others. What if I miss a dose? If you miss a dose, take it as soon as you can unless it is more than 8 hours late. If it is more than 8 hours late, skip the missed dose. Take the next dose at the normal time. Do not take extra or 2 doses at the same time to make up for the missed dose. What may interact with this medication? Do not take this medication with any of the following medications: Alfuzosin Certain medications for anxiety or sleep like midazolam, triazolam Certain medications for cancer like apalutamide, enzalutamide Certain medications for cholesterol like lovastatin, simvastatin Certain medications for irregular heart beat like amiodarone, dronedarone, flecainide, propafenone, quinidine Certain medications for pain like meperidine, piroxicam Certain medications for psychotic disorders like clozapine, lurasidone, pimozide Certain medications for seizures like carbamazepine, phenobarbital, phenytoin Colchicine Eletriptan Eplerenone Ergot alkaloids like dihydroergotamine, ergonovine, ergotamine, methylergonovine Finerenone Flibanserin Ivabradine Lomitapide Naloxegol Ranolazine Rifampin Sildenafil Silodosin St. John's Wort Tolvaptan Ubrogepant Voclosporin This medication may also interact with the  following medications: Bedaquiline Birth control pills Bosentan Certain antibiotics like erythromycin or clarithromycin Certain medications for blood pressure like amlodipine, diltiazem, felodipine, nicardipine, nifedipine Certain medications for cancer like abemaciclib, ceritinib, dasatinib, encorafenib, ibrutinib, ivosidenib, neratinib, nilotinib, venetoclax, vinblastine, vincristine Certain medications for cholesterol like atorvastatin, rosuvastatin Certain medications for depression like bupropion, trazodone Certain medications for fungal infections like isavuconazonium, itraconazole, ketoconazole, voriconazole Certain medications for hepatitis C like elbasvir; grazoprevir, dasabuvir; ombitasvir; paritaprevir; ritonavir, glecaprevir; pibrentasvir, sofosbuvir; velpatasvir; voxilaprevir Certain medications for HIV or AIDS Certain medications for irregular heartbeat like lidocaine Certain medications that treat or prevent blood clots like rivaroxaban, warfarin Digoxin Fentanyl Medications that lower your chance of fighting infection like cyclosporine, sirolimus, tacrolimus Methadone Quetiapine Rifabutin Salmeterol Steroid medications like betamethasone, budesonide, ciclesonide, dexamethasone, fluticasone, methylprednisone, mometasone, triamcinolone This list may not describe all possible interactions. Give your health care provider a list of all the medicines, herbs, non-prescription drugs, or dietary supplements you use. Also tell them if you smoke, drink alcohol, or use illegal drugs. Some items may interact with your medicine. What should I watch for while using this medication? Your condition will be monitored carefully while you are receiving this medication. Visit your care team for regular checkups. Tell your care team if your symptoms do  not start to get better or if they get worse. If you have untreated HIV infection, this medication may lead to some HIV medications not working as  well in the future. Birth control may not work properly while you are taking this medication. Talk to your care team about using an extra method of birth control. What side effects may I notice from receiving this medication? Side effects that you should report to your care team as soon as possible: Allergic reactions--skin rash, itching, hives, swelling of the face, lips, tongue, or throat Liver injury--right upper belly pain, loss of appetite, nausea, light-colored stool, dark yellow or brown urine, yellowing skin or eyes, unusual weakness or fatigue Redness, blistering, peeling, or loosening of the skin, including inside the mouth Side effects that usually do not require medical attention (report these to your care team if they continue or are bothersome): Change in taste Diarrhea General discomfort and fatigue Increase in blood pressure Muscle pain Nausea Stomach pain This list may not describe all possible side effects. Call your doctor for medical advice about side effects. You may report side effects to FDA at 1-800-FDA-1088. Where should I keep my medication? Keep out of the reach of children and pets. Store at room temperature between 20 and 25 degrees C (68 and 77 degrees F). Get rid of any unused medication after the expiration date. To get rid of medications that are no longer needed or have expired: Take the medication to a medication take-back program. Check with your pharmacy or law enforcement to find a location. If you cannot return the medication, check the label or package insert to see if the medication should be thrown out in the garbage or flushed down the toilet. If you are not sure, ask your care team. If it is safe to put it in the trash, take the medication out of the container. Mix the medication with cat litter, dirt, coffee grounds, or other unwanted substance. Seal the mixture in a bag or container. Put it in the trash. NOTE: This sheet is a summary. It may not  cover all possible information. If you have questions about this medicine, talk to your doctor, pharmacist, or health care provider.  2022 Elsevier/Gold Standard (2021-08-19 00:00:00)

## 2022-09-01 ENCOUNTER — Encounter: Payer: Self-pay | Admitting: Internal Medicine

## 2022-10-17 ENCOUNTER — Encounter: Payer: Self-pay | Admitting: Internal Medicine

## 2022-10-27 ENCOUNTER — Ambulatory Visit: Payer: BC Managed Care – PPO | Admitting: Internal Medicine

## 2022-11-06 ENCOUNTER — Ambulatory Visit (AMBULATORY_SURGERY_CENTER): Payer: BC Managed Care – PPO | Admitting: *Deleted

## 2022-11-06 VITALS — Ht 72.0 in | Wt 198.0 lb

## 2022-11-06 DIAGNOSIS — Z86010 Personal history of colonic polyps: Secondary | ICD-10-CM

## 2022-11-06 MED ORDER — NA SULFATE-K SULFATE-MG SULF 17.5-3.13-1.6 GM/177ML PO SOLN: 1.0000 | Freq: Once | ORAL | 0 refills | Status: AC

## 2022-11-06 NOTE — Progress Notes (Signed)

## 2022-12-10 ENCOUNTER — Encounter: Payer: Self-pay | Admitting: Internal Medicine

## 2022-12-12 ENCOUNTER — Ambulatory Visit (AMBULATORY_SURGERY_CENTER): Payer: BC Managed Care – PPO | Admitting: Internal Medicine

## 2022-12-12 ENCOUNTER — Encounter: Payer: Self-pay | Admitting: Internal Medicine

## 2022-12-12 VITALS — BP 113/76 | HR 79 | Temp 98.1°F | Resp 12 | Ht 72.0 in | Wt 198.0 lb

## 2022-12-12 DIAGNOSIS — D124 Benign neoplasm of descending colon: Secondary | ICD-10-CM

## 2022-12-12 DIAGNOSIS — D123 Benign neoplasm of transverse colon: Secondary | ICD-10-CM

## 2022-12-12 DIAGNOSIS — D122 Benign neoplasm of ascending colon: Secondary | ICD-10-CM

## 2022-12-12 DIAGNOSIS — Z09 Encounter for follow-up examination after completed treatment for conditions other than malignant neoplasm: Secondary | ICD-10-CM

## 2022-12-12 DIAGNOSIS — Z86010 Personal history of colonic polyps: Secondary | ICD-10-CM

## 2022-12-12 DIAGNOSIS — K635 Polyp of colon: Secondary | ICD-10-CM

## 2022-12-12 MED ORDER — SODIUM CHLORIDE 0.9 % IV SOLN: 500.0000 mL | Freq: Once | INTRAVENOUS | Status: DC

## 2022-12-12 NOTE — Op Note (Signed)
[facility] Patient Name: Darrell Brown Procedure Date: 12/12/2022 2:35 PM MRN: [id number] Endoscopist: Jerene Bears , MD, [id number] Age: 62 Referring MD:  Date of Birth: November 07, 1961 Gender: Male Account #: [id number] Procedure:                Colonoscopy Indications:              High risk colon cancer surveillance: Personal                            history of multiple adenomas, Last colonoscopy:                            December 2020 (4 adenomas removed) Medicines:                Monitored Anesthesia Care Procedure:                Pre-Anesthesia Assessment:                           - Prior to the procedure, a History and Physical                            was performed, and patient medications and                            allergies were reviewed. The patient's tolerance of                            previous anesthesia was also reviewed. The risks                            and benefits of the procedure and the sedation                            options and risks were discussed with the patient.                            All questions were answered, and informed consent                            was obtained. Prior Anticoagulants: The patient has                            taken no anticoagulant or antiplatelet agents. ASA                            Grade Assessment: II - A patient with mild systemic                            disease. After reviewing the risks and benefits,                            the patient was deemed in satisfactory condition to  undergo the procedure.                           After obtaining informed consent, the colonoscope                            was passed under direct vision. Throughout the                            procedure, the patient's blood pressure, pulse, and                            oxygen saturations were monitored continuously. The                            Olympus CF-HQ190L (Serial# [id number])  Colonoscope was                            introduced through the anus and advanced to the                            cecum, identified by appendiceal orifice and                            ileocecal valve. The colonoscopy was performed                            without difficulty. The patient tolerated the                            procedure well. The quality of the bowel                            preparation was good. The ileocecal valve,                            appendiceal orifice, and rectum were photographed. Scope In: 2:41:16 PM Scope Out: 2:57:30 PM Scope Withdrawal Time: 0 hours 12 minutes 34 seconds  Total Procedure Duration: 0 hours 16 minutes 14 seconds  Findings:                 Hemorrhoids were found on perianal exam.                           A 6 mm polyp was found in the transverse colon. The                            polyp was sessile. The polyp was removed with a                            cold snare. Resection and retrieval were complete.                           A 3 mm polyp was found in the descending colon. The  polyp was sessile. The polyp was removed with a                            cold snare. Resection and retrieval were complete.                           Multiple large-mouthed and small-mouthed                            diverticula were found in the sigmoid colon.                           External hemorrhoids were found during retroflexion                            and during digital exam. The hemorrhoids were small. Complications:            No immediate complications. Estimated Blood Loss:     Estimated blood loss was minimal. Impression:               - One 6 mm polyp in the transverse colon, removed                            with a cold snare. Resected and retrieved.                           - One 3 mm polyp in the descending colon, removed                            with a cold snare. Resected and retrieved.                            - Moderate diverticulosis in the sigmoid colon.                           - Small external hemorrhoids. Recommendation:           - Patient has a contact number available for                            emergencies. The signs and symptoms of potential                            delayed complications were discussed with the                            patient. Return to normal activities tomorrow.                            Written discharge instructions were provided to the                            patient.                           -  Resume previous diet.                           - Continue present medications.                           - Await pathology results.                           - Repeat colonoscopy is recommended for                            surveillance. The colonoscopy date will be                            determined after pathology results from today's                            exam become available for review, though likely in                            5 years. Jerene Bears, MD 12/12/2022 3:00:10 PM This report has been signed electronically.

## 2022-12-12 NOTE — Progress Notes (Unsigned)
Report to PACU, RN, vss, BBS= Clear.  

## 2022-12-12 NOTE — Progress Notes (Unsigned)
Pt's states no medical or surgical changes since previsit or office visit. 

## 2022-12-12 NOTE — Patient Instructions (Signed)
Impression/Recommendations:  Polyp, diverticulosis, and hemorrhoid handouts given to patient.  Resume previous diet. Continue present medications. Await pathology results.  Repeat colonoscopy recommended for surveillance.  Date to be determined after pathology results reviewed.  YOU HAD AN ENDOSCOPIC PROCEDURE TODAY AT [facility]:   Refer to the procedure report that was given to you for any specific questions about what was found during the examination.  If the procedure report does not answer your questions, please call your gastroenterologist to clarify.  If you requested that your care partner not be given the details of your procedure findings, then the procedure report has been included in a sealed envelope for you to review at your convenience later.  YOU SHOULD EXPECT: Some feelings of bloating in the abdomen. Passage of more gas than usual.  Walking can help get rid of the air that was put into your GI tract during the procedure and reduce the bloating. If you had a lower endoscopy (such as a colonoscopy or flexible sigmoidoscopy) you may notice spotting of blood in your stool or on the toilet paper. If you underwent a bowel prep for your procedure, you may not have a normal bowel movement for a few days.  Please Note:  You might notice some irritation and congestion in your nose or some drainage.  This is from the oxygen used during your procedure.  There is no need for concern and it should clear up in a day or so.  SYMPTOMS TO REPORT IMMEDIATELY:  Following lower endoscopy (colonoscopy or flexible sigmoidoscopy):  Excessive amounts of blood in the stool  Significant tenderness or worsening of abdominal pains  Swelling of the abdomen that is new, acute  Fever of 100F or higher  For urgent or emergent issues, a gastroenterologist can be reached at any hour by calling [phone]. Do not use MyChart messaging for urgent concerns.    DIET:  We do recommend a  small meal at first, but then you may proceed to your regular diet.  Drink plenty of fluids but you should avoid alcoholic beverages for 24 hours.  ACTIVITY:  You should plan to take it easy for the rest of today and you should NOT DRIVE or use heavy machinery until tomorrow (because of the sedation medicines used during the test).    FOLLOW UP: Our staff will call the number listed on your records the next business day following your procedure.  We will call around 7:15- 8:00 am to check on you and address any questions or concerns that you may have regarding the information given to you following your procedure. If we do not reach you, we will leave a message.     If any biopsies were taken you will be contacted by phone or by letter within the next 1-3 weeks.  Please call us at [phone] if you have not heard about the biopsies in 3 weeks.    SIGNATURES/CONFIDENTIALITY: You and/or your care partner have signed paperwork which will be entered into your electronic medical record.  These signatures attest to the fact that that the information above on your After Visit Summary has been reviewed and is understood.  Full responsibility of the confidentiality of this discharge information lies with you and/or your care-partner.

## 2022-12-12 NOTE — Progress Notes (Unsigned)
GASTROENTEROLOGY PROCEDURE H&P NOTE   Primary Care Physician: Hoyt Koch, MD    Reason for Procedure:  History of colon polyps  Plan:    Surveillance colonoscopy  Patient is appropriate for endoscopic procedure(s) in the ambulatory ([place]) setting.  The nature of the procedure, as well as the risks, benefits, and alternatives were carefully and thoroughly reviewed with the patient. Ample time for discussion and questions allowed. The patient understood, was satisfied, and agreed to proceed.      HPI: Darrell Brown is a 62 y.o. male who presents for surveillance colonoscopy.  Medical history as below.  Tolerated the prep.  No recent chest pain or shortness of breath.  No abdominal pain today, but reports of late he has noticed some dull left lower quadrant pain.  He has had diverticulitis before but this is much less severe than that to this point.  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   Clot    LEFT SIDE OF BRAIN   Costochondritis    Degenerative disc disease    Diabetes mellitus without complication (HCC)    Diverticulitis    Familial tremor    GERD (gastroesophageal reflux disease)    "NO MEDS EVERY ONCE IN A WHILE"   History of chicken pox    History of low back pain    History of mumps    Hyperlipidemia    Hypertension    Neural hearing loss, bilateral    WEAR HEARING AIDS BILATERAL   Stroke ([place])    Substance abuse ([place])    TOBACCO    Past Surgical History:  Procedure Laterality Date   COLONOSCOPY     cyst removed from neck     INGUINAL HERNIA REPAIR     Left    POLYPECTOMY     STROKE SURGERY     JAN,2009    Prior to Admission medications   Medication Sig Start Date End Date Taking? Authorizing Provider  ibuprofen (ADVIL,MOTRIN) 200 MG tablet Take 200-800 mg by mouth every 6 (six) hours as needed for moderate  pain. Patient not taking: Reported on 11/06/2022    [provider]  rosuvastatin (CRESTOR) 5 MG tablet Take 1 tablet (5 mg total) by mouth every other day. Patient not taking: Reported on 11/06/2022 04/29/22   Hoyt Koch, MD    Current Outpatient Medications  Medication Sig Dispense Refill   ibuprofen (ADVIL,MOTRIN) 200 MG tablet Take 200-800 mg by mouth every 6 (six) hours as needed for moderate pain. (Patient not taking: Reported on 11/06/2022)     rosuvastatin (CRESTOR) 5 MG tablet Take 1 tablet (5 mg total) by mouth every other day. (Patient not taking: Reported on 11/06/2022) 45 tablet 3   Current Facility-Administered Medications  Medication Dose Route Frequency Provider Last Rate Last Admin   0.9 %  sodium chloride infusion  500 mL Intravenous Once Vanette Noguchi, Lajuan Lines, MD        Allergies as of 12/12/2022   (No Known Allergies)    Family History  Problem Relation Age of Onset   Diabetes Mother    Coronary artery disease Mother    Arthritis Mother        has had TKR, THR   Parkinsonism Father    Mental illness Father    Stroke Father  Hypertension Sister    Coronary artery disease Maternal Grandmother    Cancer Neg Hx        colon, prostate   Colon cancer Neg Hx    Esophageal cancer Neg Hx    Stomach cancer Neg Hx    Rectal cancer Neg Hx    Colon polyps Neg Hx    Crohn's disease Neg Hx    Ulcerative colitis Neg Hx     Social History   Socioeconomic History   Marital status: Married    Spouse name: Not on file   Number of children: 2   Years of education: 12   Highest education level: Not on file  Occupational History   Occupation: Museum/gallery curator    Employer: [employer]  Tobacco Use   Smoking status: Every Day    Packs/day: 0.50    Years: 30.00    Total pack years: 15.00    Types: Cigarettes    Passive exposure: Never   Smokeless tobacco: Never   Tobacco comments:    10 cigarettes per day  Vaping Use   Vaping Use:  Never used  Substance and Sexual Activity   Alcohol use: Yes    Alcohol/week: 1.0 standard drink of alcohol    Types: 1 Standard drinks or equivalent per week    Comment: OCCASSIONALLY   Drug use: No   Sexual activity: Yes    Partners: Female  Other Topics Concern   Not on file  Social History Narrative   HSG. Married '81. Wife with h/o breast cancer.1 son '85, 1 daughter '88. 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 Life is good   Scientist, physiological Strain: Not on file  Food Insecurity: Not on file  Transportation Needs: Not on file  Physical Activity: Not on file  Stress: Not on file  Social Connections: Not on file  Intimate Partner Violence: Not on file    Physical Exam: Vital signs in last 24 hours: '@BP'$  106/61   Pulse 89   Temp 98.1 F (36.7 C)   Resp 16   Ht 6' (1.829 m)   Wt 198 lb (89.8 kg)   SpO2 95%   BMI 26.85 kg/m  GEN: NAD EYE: Sclerae anicteric ENT: MMM CV: Non-tachycardic Pulm: CTA b/l GI: Soft, NT/ND NEURO:  Alert & Oriented x 3   Zenovia Jarred, MD [facility]  12/12/2022 3:00 PM

## 2022-12-12 NOTE — Progress Notes (Unsigned)
Called to room to assist during endoscopic procedure.  Patient ID and intended procedure confirmed with present staff. Received instructions for my participation in the procedure from the performing physician.  

## 2022-12-15 ENCOUNTER — Telehealth: Payer: Self-pay | Admitting: *Deleted

## 2022-12-15 NOTE — Telephone Encounter (Signed)
  Follow up Call-     12/12/2022    2:06 PM  Call back number  Post procedure Call Back phone  # [phone]  Permission to leave phone message Yes     Patient questions:  Do you have a fever, pain , or abdominal swelling? No. Pain Score  0 *  Have you tolerated food without any problems? Yes.    Have you been able to return to your normal activities? Yes.    Do you have any questions about your discharge instructions: Diet   No. Medications  No. Follow up visit  No.  Do you have questions or concerns about your Care? No.  Actions: * If pain score is 4 or above: No action needed, pain <4.

## 2022-12-22 ENCOUNTER — Encounter: Payer: Self-pay | Admitting: Internal Medicine

## 2022-12-29 NOTE — Progress Notes (Unsigned)
    Subjective:    Patient ID: Darrell Brown, male    DOB: [birth year], 62 y.o.   MRN: [id number]      HPI Darrell Brown is here for No chief complaint on file.    Lump on arm -     Medications and allergies reviewed with patient and updated if appropriate.  Current Outpatient Medications on File Prior to Visit  Medication Sig Dispense Refill   ibuprofen (ADVIL,MOTRIN) 200 MG tablet Take 200-800 mg by mouth every 6 (six) hours as needed for moderate pain. (Patient not taking: Reported on 11/06/2022)     rosuvastatin (CRESTOR) 5 MG tablet Take 1 tablet (5 mg total) by mouth every other day. (Patient not taking: Reported on 11/06/2022) 45 tablet 3   Current Facility-Administered Medications on File Prior to Visit  Medication Dose Route Frequency Provider Last Rate Last Admin   0.9 %  sodium chloride infusion  500 mL Intravenous Once Pyrtle, Lajuan Lines, MD        Review of Systems     Objective:  There were no vitals filed for this visit. BP Readings from Last 3 Encounters:  12/12/22 113/76  04/29/22 130/80  06/06/21 (!) 138/92   Wt Readings from Last 3 Encounters:  12/12/22 198 lb (89.8 kg)  11/06/22 198 lb (89.8 kg)  04/29/22 201 lb 6.4 oz (91.4 kg)   There is no height or weight on file to calculate BMI.    Physical Exam         Assessment & Plan:    See Problem List for Assessment and Plan of chronic medical problems.

## 2022-12-30 ENCOUNTER — Ambulatory Visit: Payer: BC Managed Care – PPO | Admitting: Internal Medicine

## 2022-12-30 ENCOUNTER — Encounter: Payer: Self-pay | Admitting: Internal Medicine

## 2022-12-30 VITALS — BP 130/82 | HR 80 | Temp 98.2°F | Ht 72.0 in | Wt 196.8 lb

## 2022-12-30 DIAGNOSIS — M7021 Olecranon bursitis, right elbow: Secondary | ICD-10-CM

## 2022-12-30 NOTE — Patient Instructions (Addendum)
Medications changes include :   take ibuprofen '600mg'$  three times a day with food - only take short term  You can ice the elbow and consider wearing compression.    Return if symptoms worsen or fail to improve.   Elbow Bursitis  Elbow (olecranon) bursitis is the swelling of the fluid-filled sac (bursa) between the tip of your elbow and your skin. A bursa acts as a cushion and protects the joint. If the bursa becomes irritated, it can fill with extra fluid and become inflamed. This condition is also called olecranon bursitis. What are the causes? This condition may be caused by: An elbow injury, such as falling onto the elbow. Leaning on hard surfaces for long periods of time. Infection from an injury that breaks the skin near the elbow. A bone growth (spur) that forms at the tip of the elbow. A medical condition that causes inflammation, such as gout or rheumatoid arthritis. Sometimes the cause is not known. What are the signs or symptoms? The first sign of elbow bursitis is usually swelling at the tip of the elbow. This can grow to be about the size of a golf ball. Swelling may start suddenly or develop gradually. Other symptoms may include: Pain when bending or leaning on the elbow. Stiffness, or not being able to move the elbow normally. If bursitis is caused by an infection, you may have: Redness, warmth, and tenderness of the elbow. Drainage of pus from the swollen area over the elbow, if the skin breaks open. How is this diagnosed? This condition may be diagnosed based on: Your symptoms and medical history. Any recent injuries you have had. A physical exam. X-rays to check for a bone spur or fracture. Draining fluid from the bursa to test it for infection. Blood tests to rule out gout or rheumatoid arthritis. How is this treated? Treatment for this condition depends on the cause. Treatment may include: Medicines. These may include: Over-the-counter medicines to  relieve pain and inflammation. Antibiotic medicines if there is an infection. Injections of anti-inflammatory medicines (steroids). Draining fluid from the bursa. Wrapping your elbow with a bandage or compression arm sleeve. Wearing elbow pads. If these treatments do not help, you may need surgery to remove the bursa. Follow these instructions at home: Medicines Take over-the-counter and prescription medicines only as told by your health care provider. If you were prescribed an antibiotic medicine, take it as told by your health care provider. Do not stop taking the antibiotic even if you start to feel better. Managing pain, stiffness, and swelling     If directed, put ice on the affected area. To do this: Put ice in a plastic bag. Place a towel between your skin and the bag. Leave the ice on for 20 minutes, 2-3 times a day. Remove the ice if your skin turns bright red. This is very important. If you cannot feel pain, heat, or cold, you have a greater risk of damage to the area. If directed, apply heat to the affected area as often as told by your health care provider. Use the heat source that your health care provider recommends, such as a moist heat pack or a heating pad. Place a towel between your skin and the heat source. Leave the heat on 20-30 minutes. Remove the heat if your skin turns bright red. This is especially important if you are unable to feel pain, heat, or cold. You may have a greater risk of getting burned. If your bursitis  is caused by an injury, rest your elbow and wear your bandage or sleeve as told by your health care provider. Use elbow pads or elbow wraps to cushion your elbow and control swelling as needed. General instructions Avoid any activities that cause elbow pain. Ask your health care provider what activities are safe for you. Keep all follow-up visits. This is important. Contact a health care provider if: You have a fever. Your symptoms do not get  better with treatment. You have pain or swelling that gets worse, or it goes away and then comes back. You have pus draining from your elbow. You have redness around the elbow area. Your elbow is warm to the touch. Get help right away if: You have trouble moving your arm, hand, or fingers. Summary Elbow (olecranon) bursitis is the swelling of the fluid-filled sac (bursa) between the tip of your elbow and your skin. Treatment for elbow bursitis depends on the cause. It may include medicines to relieve pain and inflammation, antibiotic medicines to treat an infection, or draining fluid from your elbow. Contact a health care provider if your symptoms do not get better with treatment, or if your symptoms go away and then come back. This information is not intended to replace advice given to you by your health care provider. Make sure you discuss any questions you have with your health care provider. Document Revised: 11/12/2021 Document Reviewed: 11/12/2021 Elsevier Patient Education  [place].

## 2023-05-01 ENCOUNTER — Ambulatory Visit (INDEPENDENT_AMBULATORY_CARE_PROVIDER_SITE_OTHER): Payer: BC Managed Care – PPO | Admitting: Internal Medicine

## 2023-05-01 ENCOUNTER — Encounter: Payer: Self-pay | Admitting: Internal Medicine

## 2023-05-01 VITALS — BP 142/100 | HR 79 | Temp 98.4°F | Ht 72.0 in | Wt 193.0 lb

## 2023-05-01 DIAGNOSIS — E785 Hyperlipidemia, unspecified: Secondary | ICD-10-CM

## 2023-05-01 DIAGNOSIS — E1159 Type 2 diabetes mellitus with other circulatory complications: Secondary | ICD-10-CM

## 2023-05-01 DIAGNOSIS — Z8673 Personal history of transient ischemic attack (TIA), and cerebral infarction without residual deficits: Secondary | ICD-10-CM

## 2023-05-01 DIAGNOSIS — E1169 Type 2 diabetes mellitus with other specified complication: Secondary | ICD-10-CM

## 2023-05-01 DIAGNOSIS — T466X5A Adverse effect of antihyperlipidemic and antiarteriosclerotic drugs, initial encounter: Secondary | ICD-10-CM

## 2023-05-01 DIAGNOSIS — Z720 Tobacco use: Secondary | ICD-10-CM | POA: Diagnosis not present

## 2023-05-01 DIAGNOSIS — M791 Myalgia, unspecified site: Secondary | ICD-10-CM

## 2023-05-01 DIAGNOSIS — I10 Essential (primary) hypertension: Secondary | ICD-10-CM

## 2023-05-01 DIAGNOSIS — Z0000 Encounter for general adult medical examination without abnormal findings: Secondary | ICD-10-CM | POA: Diagnosis not present

## 2023-05-01 LAB — COMPREHENSIVE METABOLIC PANEL
ALT: 24 U/L (ref 0–53)
AST: 23 U/L (ref 0–37)
Albumin: 4.3 g/dL (ref 3.5–5.2)
Alkaline Phosphatase: 57 U/L (ref 39–117)
BUN: 10 mg/dL (ref 6–23)
CO2: 30 mEq/L (ref 19–32)
Calcium: 9.7 mg/dL (ref 8.4–10.5)
Chloride: 100 mEq/L (ref 96–112)
Creatinine, Ser: 1.06 mg/dL (ref 0.40–1.50)
GFR: 75.14 mL/min (ref >60.00)
Glucose, Bld: 129 mg/dL — ABNORMAL HIGH (ref 70–99)
Potassium: 4.4 mEq/L (ref 3.5–5.1)
Sodium: 136 mEq/L (ref 135–145)
Total Bilirubin: 0.6 mg/dL (ref 0.2–1.2)
Total Protein: 7.2 g/dL (ref 6.0–8.3)

## 2023-05-01 LAB — LIPID PANEL
Cholesterol: 137 mg/dL (ref 0–200)
HDL: 33.4 mg/dL — ABNORMAL LOW (ref >39.00)
LDL Cholesterol: 88 mg/dL (ref 0–99)
NonHDL: 103.79
Total CHOL/HDL Ratio: 4
Triglycerides: 79 mg/dL (ref 0.0–149.0)
VLDL: 15.8 mg/dL (ref 0.0–40.0)

## 2023-05-01 LAB — MICROALBUMIN / CREATININE URINE RATIO
Creatinine,U: 65.7 mg/dL
Microalb Creat Ratio: 1.1 mg/g (ref 0.0–30.0)
Microalb, Ur: <0.7 mg/dL (ref 0.0–1.9)

## 2023-05-01 LAB — CBC
HCT: 48.9 % (ref 39.0–52.0)
Hemoglobin: 15.8 g/dL (ref 13.0–17.0)
MCHC: 32.2 g/dL (ref 30.0–36.0)
MCV: 92.7 fl (ref 78.0–100.0)
Platelets: 280 10*3/uL (ref 150.0–400.0)
RBC: 5.28 Mil/uL (ref 4.22–5.81)
RDW: 13.9 % (ref 11.5–15.5)
WBC: 6.1 10*3/uL (ref 4.0–10.5)

## 2023-05-01 LAB — HEMOGLOBIN A1C: Hgb A1c MFr Bld: 7.9 % — ABNORMAL HIGH (ref 4.6–6.5)

## 2023-05-01 NOTE — Assessment & Plan Note (Signed)
Not taking crestor at all due to joint pain. Checking lipid panel and adjust as needed.

## 2023-05-01 NOTE — Assessment & Plan Note (Signed)
Counseled to quit and he is not motivated. Reminded about risk/harm from smoking.

## 2023-05-01 NOTE — Assessment & Plan Note (Signed)
BP above goal today and not on meds. Typically normal at home and normal other 3 visits so far this year.

## 2023-05-01 NOTE — Patient Instructions (Signed)
Work on stopping smoking.

## 2023-05-01 NOTE — Assessment & Plan Note (Signed)
Checking hgA1c. BP high today but normal at home and normal several other visits in recent months. Not on medication. He is taking aspirin daily.

## 2023-05-01 NOTE — Assessment & Plan Note (Signed)
Flu shot yearly. Shingrix complete. Tetanus up to date. Colonoscopy up to date. Counseled about sun safety and mole surveillance. Counseled about the dangers of distracted driving. Given 10 year screening recommendations.   

## 2023-05-01 NOTE — Assessment & Plan Note (Signed)
Did stop taking pravastatin and crestor due to myalgia.

## 2023-05-01 NOTE — Progress Notes (Signed)
   Subjective:   Patient ID: Darrell Brown, male    DOB: 1961-06-05, 62 y.o.   MRN: [id number]  HPI The patient is here for physical.  PMH, [facility], social history reviewed and updated  Review of Systems  Constitutional: Negative.   HENT: Negative.    Eyes: Negative.   Respiratory:  Negative for cough, chest tightness and shortness of breath.   Cardiovascular:  Negative for chest pain, palpitations and leg swelling.  Gastrointestinal:  Negative for abdominal distention, abdominal pain, constipation, diarrhea, nausea and vomiting.  Musculoskeletal: Negative.   Skin: Negative.   Neurological: Negative.   Psychiatric/Behavioral: Negative.      Objective:  Physical Exam Constitutional:      Appearance: He is well-developed.  HENT:     Head: Normocephalic and atraumatic.  Cardiovascular:     Rate and Rhythm: Normal rate and regular rhythm.  Pulmonary:     Effort: Pulmonary effort is normal. No respiratory distress.     Breath sounds: Normal breath sounds. No wheezing or rales.  Abdominal:     General: Bowel sounds are normal. There is no distension.     Palpations: Abdomen is soft.     Tenderness: There is no abdominal tenderness. There is no rebound.  Musculoskeletal:     Cervical back: Normal range of motion.  Skin:    General: Skin is warm and dry.  Neurological:     Mental Status: He is alert and oriented to person, place, and time.     Coordination: Coordination normal.     Vitals:   05/01/23 0904 05/01/23 0906  BP: (!) 142/100 (!) 142/100  Pulse: 79   Temp: 98.4 F (36.9 C)   TempSrc: Oral   SpO2: 93%   Weight: 193 lb (87.5 kg)   Height: 6' (1.829 m)     Assessment & Plan:

## 2023-05-01 NOTE — Assessment & Plan Note (Signed)
Checking HgA1c, foot exam done. Checking microalbumin to creatinine ratio and CMP and lipid panel. Diet controlled previously. Not on statin due to side effects. Adjust as needed.

## 2023-06-01 ENCOUNTER — Encounter: Payer: Self-pay | Admitting: Family Medicine

## 2023-06-01 ENCOUNTER — Ambulatory Visit: Payer: BC Managed Care – PPO | Admitting: Family Medicine

## 2023-06-01 VITALS — BP 140/84 | HR 80 | Temp 97.9°F | Resp 20 | Ht 72.0 in | Wt 193.0 lb

## 2023-06-01 DIAGNOSIS — M25511 Pain in right shoulder: Secondary | ICD-10-CM | POA: Diagnosis not present

## 2023-06-01 MED ORDER — IBUPROFEN 600 MG PO TABS: 600.0000 mg | ORAL_TABLET | Freq: Four times a day (QID) | ORAL | 0 refills | Status: AC | PRN

## 2023-06-01 MED ORDER — METHYLPREDNISOLONE ACETATE 80 MG/ML IJ SUSP
80.0000 mg | Freq: Once | INTRAMUSCULAR | Status: AC
  Administered 2023-06-01: 80 mg via INTRAMUSCULAR

## 2023-06-01 NOTE — Progress Notes (Signed)
   Assessment & Plan:  1. Acute pain of right shoulder Shoulder exercises provided for patient to complete at home.  Encouraged him to take ibuprofen with Tylenol 500 mg.  May also apply Biofreeze and heat.  Encouraged not to overuse his arm.  Cautioned about not using it at all and the possibility of developing a frozen shoulder. - ibuprofen (ADVIL) 600 MG tablet; Take 1 tablet (600 mg total) by mouth every 6 (six) hours as needed for moderate pain.  Dispense: 60 tablet; Refill: 0 - methylPREDNISolone acetate (DEPO-MEDROL) injection 80 mg   Follow up plan: Return if symptoms worsen or fail to improve.  Deliah Boston, MSN, APRN, FNP-C  Subjective:  HPI: Darrell Brown is a 62 y.o. male presenting on 06/01/2023 for right shoulder pain (Pain goes down arm. Woke with discomfort about 2 weeks ago and then again last Tuesday. Now getting worse./Tried: ice, salonpas, IBU, and swimming. / )  Patient is accompanied by his wife, who he is okay with being present.  Shoulder Pain: Patient complaints of right shoulder pain. No known injury. The pain is described as aching, sharp, shooting, stabbing, and throbbing that radiates down the arm.  The onset of the pain was  two weeks ago and has worsened since that time .  The pain occurs continuously.  Location is global. Symptoms are aggravated by all activities. Symptoms are diminished by rest, ice, medication: NSAID, Salonpas patches.  Limited activities include: all activities.     ROS: Negative unless specifically indicated above in HPI.   Relevant past medical history reviewed and updated as indicated.   Allergies and medications reviewed and updated.  No current outpatient medications on file.  No Known Allergies  Objective:   BP (!) 140/84   Pulse 80   Temp 97.9 F (36.6 C)   Resp 20   Ht 6' (1.829 m)   Wt 193 lb (87.5 kg)   BMI 26.18 kg/m    Physical Exam Vitals reviewed.  Constitutional:      General: He is not in acute  distress.    Appearance: Normal appearance. He is not ill-appearing, toxic-appearing or diaphoretic.  HENT:     Head: Normocephalic and atraumatic.  Eyes:     General: No scleral icterus.       Right eye: No discharge.        Left eye: No discharge.     Conjunctiva/sclera: Conjunctivae normal.  Cardiovascular:     Rate and Rhythm: Normal rate.  Pulmonary:     Effort: Pulmonary effort is normal. No respiratory distress.  Musculoskeletal:     Right shoulder: Tenderness present. No deformity, effusion, laceration, bony tenderness or crepitus. Decreased range of motion. Normal strength.     Cervical back: Normal range of motion.  Skin:    General: Skin is warm and dry.  Neurological:     Mental Status: He is alert and oriented to person, place, and time. Mental status is at baseline.  Psychiatric:        Mood and Affect: Mood normal.        Behavior: Behavior normal.        Thought Content: Thought content normal.        Judgment: Judgment normal.

## 2023-06-01 NOTE — Patient Instructions (Addendum)
Biofreeze  Take Ibuprofen & Tylenol together.

## 2023-10-08 ENCOUNTER — Ambulatory Visit: Payer: BC Managed Care – PPO

## 2024-01-19 LAB — HM DIABETES EYE EXAM

## 2024-05-23 ENCOUNTER — Ambulatory Visit: Payer: Self-pay | Admitting: Internal Medicine

## 2024-05-23 ENCOUNTER — Encounter: Payer: Self-pay | Admitting: Internal Medicine

## 2024-05-23 VITALS — BP 140/100 | HR 83 | Temp 98.1°F | Ht 72.0 in | Wt 194.0 lb

## 2024-05-23 DIAGNOSIS — E785 Hyperlipidemia, unspecified: Secondary | ICD-10-CM | POA: Diagnosis not present

## 2024-05-23 DIAGNOSIS — I10 Essential (primary) hypertension: Secondary | ICD-10-CM | POA: Diagnosis not present

## 2024-05-23 DIAGNOSIS — T466X5A Adverse effect of antihyperlipidemic and antiarteriosclerotic drugs, initial encounter: Secondary | ICD-10-CM

## 2024-05-23 DIAGNOSIS — E1159 Type 2 diabetes mellitus with other circulatory complications: Secondary | ICD-10-CM

## 2024-05-23 DIAGNOSIS — Z0000 Encounter for general adult medical examination without abnormal findings: Secondary | ICD-10-CM | POA: Diagnosis not present

## 2024-05-23 DIAGNOSIS — M791 Myalgia, unspecified site: Secondary | ICD-10-CM | POA: Diagnosis not present

## 2024-05-23 DIAGNOSIS — Z720 Tobacco use: Secondary | ICD-10-CM

## 2024-05-23 DIAGNOSIS — Z8673 Personal history of transient ischemic attack (TIA), and cerebral infarction without residual deficits: Secondary | ICD-10-CM

## 2024-05-23 DIAGNOSIS — E1169 Type 2 diabetes mellitus with other specified complication: Secondary | ICD-10-CM

## 2024-05-23 LAB — COMPREHENSIVE METABOLIC PANEL WITH GFR
ALT: 21 U/L (ref 0–53)
AST: 20 U/L (ref 0–37)
Albumin: 4.3 g/dL (ref 3.5–5.2)
Alkaline Phosphatase: 55 U/L (ref 39–117)
BUN: 10 mg/dL (ref 6–23)
CO2: 29 meq/L (ref 19–32)
Calcium: 9.3 mg/dL (ref 8.4–10.5)
Chloride: 100 meq/L (ref 96–112)
Creatinine, Ser: 0.95 mg/dL (ref 0.40–1.50)
GFR: 85.06 mL/min (ref >60.00)
Glucose, Bld: 142 mg/dL — ABNORMAL HIGH (ref 70–99)
Potassium: 4.5 meq/L (ref 3.5–5.1)
Sodium: 135 meq/L (ref 135–145)
Total Bilirubin: 0.4 mg/dL (ref 0.2–1.2)
Total Protein: 7.3 g/dL (ref 6.0–8.3)

## 2024-05-23 LAB — LIPID PANEL
Cholesterol: 133 mg/dL (ref 0–200)
HDL: 30.8 mg/dL — ABNORMAL LOW (ref >39.00)
LDL Cholesterol: 78 mg/dL (ref 0–99)
NonHDL: 102.56
Total CHOL/HDL Ratio: 4
Triglycerides: 124 mg/dL (ref 0.0–149.0)
VLDL: 24.8 mg/dL (ref 0.0–40.0)

## 2024-05-23 LAB — CBC
HCT: 46.4 % (ref 39.0–52.0)
Hemoglobin: 15.3 g/dL (ref 13.0–17.0)
MCHC: 32.9 g/dL (ref 30.0–36.0)
MCV: 91.5 fl (ref 78.0–100.0)
Platelets: 283 10*3/uL (ref 150.0–400.0)
RBC: 5.07 Mil/uL (ref 4.22–5.81)
RDW: 14.2 % (ref 11.5–15.5)
WBC: 6.1 10*3/uL (ref 4.0–10.5)

## 2024-05-23 LAB — MICROALBUMIN / CREATININE URINE RATIO
Creatinine,U: 53.1 mg/dL
Microalb Creat Ratio: UNDETERMINED mg/g (ref 0.0–30.0)
Microalb, Ur: <0.7 mg/dL

## 2024-05-23 LAB — VITAMIN B12: Vitamin B-12: 156 pg/mL — ABNORMAL LOW (ref 211–911)

## 2024-05-23 LAB — HEMOGLOBIN A1C: Hgb A1c MFr Bld: 8.4 % — ABNORMAL HIGH (ref 4.6–6.5)

## 2024-05-23 LAB — VITAMIN D 25 HYDROXY (VIT D DEFICIENCY, FRACTURES): VITD: 19.65 ng/mL — ABNORMAL LOW (ref 30.00–100.00)

## 2024-05-23 NOTE — Progress Notes (Unsigned)
   Subjective:   Patient ID: Darrell Brown, male    DOB: 05-25-1961, 63 y.o.   MRN: [id number]  HPI The patient is here for physical.  PMH, [facility], social history reviewed and updated  Review of Systems  Constitutional: Negative.   HENT: Negative.    Eyes: Negative.   Respiratory:  Negative for cough, chest tightness and shortness of breath.   Cardiovascular:  Negative for chest pain, palpitations and leg swelling.  Gastrointestinal:  Negative for abdominal distention, abdominal pain, constipation, diarrhea, nausea and vomiting.  Musculoskeletal:  Positive for arthralgias.  Skin: Negative.   Neurological: Negative.   Psychiatric/Behavioral: Negative.      Objective:  Physical Exam Constitutional:      Appearance: He is well-developed.  HENT:     Head: Normocephalic and atraumatic.   Cardiovascular:     Rate and Rhythm: Normal rate and regular rhythm.  Pulmonary:     Effort: Pulmonary effort is normal. No respiratory distress.     Breath sounds: Normal breath sounds. No wheezing or rales.  Abdominal:     General: Bowel sounds are normal. There is no distension.     Palpations: Abdomen is soft.     Tenderness: There is no abdominal tenderness. There is no rebound.   Musculoskeletal:     Cervical back: Normal range of motion.   Skin:    General: Skin is warm and dry.   Neurological:     Mental Status: He is alert and oriented to person, place, and time.     Coordination: Coordination normal.     Vitals:   05/23/24 0933 05/23/24 0935  BP: (!) 140/100 (!) 140/100  Pulse: 83   Temp: 98.1 F (36.7 C)   TempSrc: Oral   SpO2: 98%   Weight: 194 lb (88 kg)   Height: 6' (1.829 m)     Assessment & Plan:

## 2024-05-26 ENCOUNTER — Encounter: Payer: Self-pay | Admitting: Internal Medicine

## 2024-05-26 NOTE — Assessment & Plan Note (Signed)
 Checking lipid panel and adjust as needed. Previously on statin and did stop due to joint pain.

## 2024-05-26 NOTE — Assessment & Plan Note (Signed)
 Counseled that he should stop. No breathlessness yet but is likely close to developing. He is unsure about if he is able to make attempt.

## 2024-05-26 NOTE — Assessment & Plan Note (Signed)
 Has declined to resume statin previously and is indicated for diabetes and prior stroke.

## 2024-05-26 NOTE — Assessment & Plan Note (Signed)
 Flu shot yearly. Pneumonia counseled. Shingrix  complete. Tetanus up to date. Colonoscopy up to date. Counseled about sun safety and mole surveillance. Counseled about the dangers of distracted driving. Given 10 year screening recommendations.

## 2024-05-26 NOTE — Assessment & Plan Note (Signed)
 No new symptoms. Checking lipid panel and HgA1c. BP borderline today but typically at home at home.

## 2024-05-26 NOTE — Assessment & Plan Note (Signed)
 Checking HgA1c, UACR, lipid panel and CMP. Diet controlled currently. Prior stroke. Adjust as needed. Prior myalgia with statin and declines.

## 2024-05-26 NOTE — Assessment & Plan Note (Signed)
 Some low BP episodes at home. Controlled and has readings. Borderline today. Not on meds. Checking CMP and UACR an adjust as needed.

## 2024-05-31 ENCOUNTER — Ambulatory Visit: Payer: Self-pay | Admitting: Internal Medicine

## 2024-05-31 ENCOUNTER — Telehealth: Payer: Self-pay

## 2024-05-31 MED ORDER — METFORMIN HCL 500 MG PO TABS: 500.0000 mg | ORAL_TABLET | Freq: Every day | ORAL | 3 refills | Status: AC

## 2024-05-31 MED ORDER — VITAMIN D (ERGOCALCIFEROL) 1.25 MG (50000 UNIT) PO CAPS: 50000.0000 [IU] | ORAL_CAPSULE | ORAL | 0 refills | Status: AC

## 2024-05-31 NOTE — Telephone Encounter (Signed)
 Copied from CRM [id number]. Topic: Clinical - Medical Advice >> May 31, 2024  4:14 PM Darrell Brown wrote: Reason for CRM: Patient called in to inquire about metformin.He read some information regarding this medication and seen that it may not be good for the kidneys,and with him only having 1 kidney.He wonders if this medication is safe?

## 2024-06-01 NOTE — Telephone Encounter (Signed)
**Note De-identified  [place] Obfuscation** Please advise 

## 2024-06-02 ENCOUNTER — Ambulatory Visit

## 2024-06-02 DIAGNOSIS — E538 Deficiency of other specified B group vitamins: Secondary | ICD-10-CM

## 2024-06-02 MED ORDER — CYANOCOBALAMIN 1000 MCG/ML IJ SOLN
1000.0000 ug | Freq: Once | INTRAMUSCULAR | Status: AC
  Administered 2024-06-02: 1000 ug via INTRAMUSCULAR

## 2024-06-02 NOTE — Progress Notes (Signed)
After obtaining consent, and per orders of Dr. Sharlet Salina, injection of B12 given by Marrian Salvage. Patient instructed to report any adverse reaction to me immediately.

## 2024-06-06 NOTE — Telephone Encounter (Signed)
 Yes this is safe for him to try.

## 2024-06-07 NOTE — Telephone Encounter (Signed)
 Spoke with patient.

## 2024-06-09 ENCOUNTER — Ambulatory Visit (INDEPENDENT_AMBULATORY_CARE_PROVIDER_SITE_OTHER)

## 2024-06-09 ENCOUNTER — Ambulatory Visit

## 2024-06-09 DIAGNOSIS — E538 Deficiency of other specified B group vitamins: Secondary | ICD-10-CM | POA: Diagnosis not present

## 2024-06-09 MED ORDER — CYANOCOBALAMIN 1000 MCG/ML IJ SOLN
1000.0000 ug | Freq: Once | INTRAMUSCULAR | Status: AC
  Administered 2024-06-09: 1000 ug via INTRAMUSCULAR

## 2024-06-09 NOTE — Progress Notes (Signed)
 Pt here for monthly B12 injection per   B12 1000mcg given IM and pt tolerated injection well.  Next B12 injection scheduled for   Provider is not in office today sending this over to DOD.  Patient responded well to injection on the right arm

## 2024-06-16 ENCOUNTER — Ambulatory Visit (INDEPENDENT_AMBULATORY_CARE_PROVIDER_SITE_OTHER)

## 2024-06-16 DIAGNOSIS — E538 Deficiency of other specified B group vitamins: Secondary | ICD-10-CM

## 2024-06-16 MED ORDER — CYANOCOBALAMIN 1000 MCG/ML IJ SOLN
1000.0000 ug | Freq: Once | INTRAMUSCULAR | Status: AC
  Administered 2024-06-16: 1000 ug via INTRAMUSCULAR

## 2024-06-16 NOTE — Progress Notes (Signed)
 Patient here for 3rd weekly B12 (out of 4) injection per Dr. Rollene. B12 1000 mcg given in left IM and patient tolerated injection well today.

## 2024-06-23 ENCOUNTER — Ambulatory Visit

## 2024-06-23 DIAGNOSIS — E538 Deficiency of other specified B group vitamins: Secondary | ICD-10-CM

## 2024-06-23 MED ORDER — CYANOCOBALAMIN 1000 MCG/ML IJ SOLN
1000.0000 ug | Freq: Once | INTRAMUSCULAR | Status: AC
  Administered 2024-06-23: 1000 ug via INTRAMUSCULAR

## 2024-06-23 NOTE — Progress Notes (Addendum)
 After obtaining consent, and per orders of Dr. Rollene, injection of B12 given by Ronnald SHAUNNA Palms. Patient instructed to  to report any adverse reaction to me immediately.   Medical screening examination/treatment/procedure(s) were performed by non-physician practitioner and as supervising physician I was immediately available for consultation/collaboration.  I agree with above. Karlynn Noel, MD

## 2024-09-01 ENCOUNTER — Telehealth: Payer: Self-pay

## 2024-09-01 NOTE — Telephone Encounter (Signed)
 Copied from CRM #[id number]. Topic: Clinical - Medication Question >> Sep 01, 2024  9:46 AM Mercedes MATSU wrote: Reason for CRM: Patient wants to know if he can take vitamin d  still or if he should be retested. Patient is requesting a call back and be reached at [phone].

## 2024-09-01 NOTE — Telephone Encounter (Signed)
**Note De-identified  [place] Obfuscation** Please advise 

## 2024-09-02 NOTE — Telephone Encounter (Signed)
 Ok to stop high dose vitamin d  and take 2000 units daily over the counter vitamin d 

## 2024-09-02 NOTE — Telephone Encounter (Signed)
 Called patient back and relayed the message in regards to this
